# Patient Record
Sex: Female | Born: 1995 | Race: White | Hispanic: No | Marital: Single | Smoking: Former smoker
Health system: Southern US, Community
[De-identification: ages and names within clinical notes are randomized; demographics above are authoritative.]

## PROBLEM LIST (undated history)

## (undated) DIAGNOSIS — S6991XS Unspecified injury of right wrist, hand and finger(s), sequela: Secondary | ICD-10-CM

## (undated) DIAGNOSIS — S61210A Laceration without foreign body of right index finger without damage to nail, initial encounter: Secondary | ICD-10-CM

## (undated) DIAGNOSIS — O0930 Supervision of pregnancy with insufficient antenatal care, unspecified trimester: Secondary | ICD-10-CM

## (undated) DIAGNOSIS — F32A Depression, unspecified: Secondary | ICD-10-CM

## (undated) DIAGNOSIS — J302 Other seasonal allergic rhinitis: Secondary | ICD-10-CM

## (undated) HISTORY — DX: Other seasonal allergic rhinitis: J30.2

## (undated) HISTORY — PX: NO PAST SURGERIES: SHX2092

## (undated) HISTORY — PX: HX DENTAL EXTRACTION: 2100001168

## (undated) HISTORY — DX: Unspecified injury of right wrist, hand and finger(s), sequela: S69.91XS

## (undated) HISTORY — DX: Supervision of pregnancy with insufficient antenatal care, unspecified trimester: O09.30

## (undated) HISTORY — DX: Laceration without foreign body of right index finger without damage to nail, initial encounter: S61.210A

---
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
## 2016-03-21 DIAGNOSIS — F419 Anxiety disorder, unspecified: Secondary | ICD-10-CM | POA: Insufficient documentation

## 2016-03-21 DIAGNOSIS — F411 Generalized anxiety disorder: Secondary | ICD-10-CM | POA: Insufficient documentation

## 2016-04-13 NOTE — L&D Delivery Note (Addendum)
VAGINAL DELIVERY NOTE      Delivery Date: 02/23/2017   Delivery Time: 0737      Patient Name: Crystal Sweeney, Crystal Sweeney  Delivering Provider: Iva Lento, CNM    Delivery Type: Vaginal, Spontaneous Delivery         Labor Course:  Jeniah pushed about 45 minutes and brought baby to crowning easily. The head presented on the perineum which appeared to be adequate.  The baby's head was delivered in a controlled fashion from ROA position.      The neck was checked and there was a nuchal cord which was delivered through.  The baby's shoulders and body delivered easily. The baby was placed on mom's belly where she was dried off.  The cord was doubly clamped and cut and cord bloods were obtained.    The placenta was delivered spontaneously, completely and intact with a three vessel cord.  The perineum, periurethra, and vagina were checked for lacerations. Uterus started to become boggy and fundal massage helped to firm up. Completed manual removal of small clots. Rectal cytotec given.     Episiotomy / Laceration:    Episiotomy: na   Lacerations: 2nd peri and labial   Repair suture: 3.0 vicryl rapide ct and sh     The patient's fundus is firm and there is adequate hemostasis.  She is stable.    Infant:  Crystal Sweeney was a viable female  infant with APGARs of 8  and 9  at 1 and 5 minutes respectively.  Weight pending    Estimated Blood Loss:    Estimated Blood Loss: 400 mL (Filed from Delivery Summary)      Signed by: Iva Lento, CNM

## 2016-07-15 LAB — VH RUBELLA SCREEN, SERUM: Rubella IgG: IMMUNE

## 2016-07-15 LAB — VH STD AMPLIFIED DNA PROBE
Chlamydia trachomatis: NEGATIVE
Neisseria gonorrhoeae: NEGATIVE

## 2016-07-15 LAB — ABO/RH: ABO Rh: A NEG

## 2016-07-15 LAB — CBC
Hematocrit: 37.6 % (ref 36.0–46.0)
Hgb: 12.9 gm/dL (ref 11.5–16.0)
PLT CT: 307 10*3/uL

## 2016-07-15 LAB — RPR: RPR: NONREACTIVE

## 2016-07-15 LAB — HEPATITIS B SURFACE ANTIGEN W/ REFLEX TO CONFIRMATION: Hepatitis B Surface Antigen: NEGATIVE

## 2016-07-15 LAB — ANTIBODY SCREEN: AB Screen Gel: NEGATIVE

## 2016-07-15 LAB — HIV AG/AB 4TH GENERATION: HIV Ag/Ab, 4th Generation: NEGATIVE

## 2016-08-17 ENCOUNTER — Ambulatory Visit
Admission: RE | Admit: 2016-08-17 | Discharge: 2016-08-17 | Disposition: A | Discharge status: Home / Self Care | Payer: Self-pay | Source: Ambulatory Visit | Attending: Family | Admitting: Family

## 2016-09-09 DIAGNOSIS — Z6791 Unspecified blood type, Rh negative: Secondary | ICD-10-CM | POA: Diagnosis present

## 2016-10-09 ENCOUNTER — Encounter: Payer: Self-pay | Admitting: Maternal & Fetal Medicine

## 2016-10-09 ENCOUNTER — Ambulatory Visit
Admission: RE | Admit: 2016-10-09 | Discharge: 2016-10-09 | Disposition: A | Discharge status: Home / Self Care | Payer: Commercial Managed Care - POS | Source: Ambulatory Visit | Attending: Family | Admitting: Family

## 2016-10-09 DIAGNOSIS — Z3686 Encounter for antenatal screening for cervical length: Secondary | ICD-10-CM | POA: Insufficient documentation

## 2016-10-09 DIAGNOSIS — Z3A20 20 weeks gestation of pregnancy: Secondary | ICD-10-CM | POA: Insufficient documentation

## 2016-10-09 DIAGNOSIS — Z3490 Encounter for supervision of normal pregnancy, unspecified, unspecified trimester: Secondary | ICD-10-CM

## 2016-10-09 DIAGNOSIS — O355XX0 Maternal care for (suspected) damage to fetus by drugs, not applicable or unspecified: Secondary | ICD-10-CM | POA: Insufficient documentation

## 2016-10-09 DIAGNOSIS — O358XX0 Maternal care for other (suspected) fetal abnormality and damage, not applicable or unspecified: Secondary | ICD-10-CM | POA: Insufficient documentation

## 2016-11-26 ENCOUNTER — Encounter (HOSPITAL_BASED_OUTPATIENT_CLINIC_OR_DEPARTMENT_OTHER): Payer: Self-pay

## 2016-12-17 DIAGNOSIS — O99019 Anemia complicating pregnancy, unspecified trimester: Secondary | ICD-10-CM | POA: Insufficient documentation

## 2016-12-17 HISTORY — DX: Anemia complicating pregnancy, unspecified trimester: O99.019

## 2016-12-30 ENCOUNTER — Ambulatory Visit
Admission: RE | Admit: 2016-12-30 | Discharge: 2016-12-30 | Disposition: A | Discharge status: Home / Self Care | Payer: Commercial Managed Care - POS | Source: Ambulatory Visit | Attending: Family | Admitting: Family

## 2016-12-30 ENCOUNTER — Encounter: Payer: Self-pay | Admitting: Maternal & Fetal Medicine

## 2016-12-30 DIAGNOSIS — O358XX0 Maternal care for other (suspected) fetal abnormality and damage, not applicable or unspecified: Secondary | ICD-10-CM | POA: Insufficient documentation

## 2016-12-30 DIAGNOSIS — Z3A32 32 weeks gestation of pregnancy: Secondary | ICD-10-CM | POA: Insufficient documentation

## 2016-12-30 DIAGNOSIS — Z3490 Encounter for supervision of normal pregnancy, unspecified, unspecified trimester: Secondary | ICD-10-CM

## 2017-01-27 LAB — GROUP B STREP TRANSCRIBED: GBS Transcribed: NEGATIVE

## 2017-02-22 ENCOUNTER — Inpatient Hospital Stay: Payer: Commercial Managed Care - POS | Admitting: Anesthesiology

## 2017-02-22 ENCOUNTER — Inpatient Hospital Stay
Admission: EM | Admit: 2017-02-22 | Payer: Commercial Managed Care - POS | Source: Ambulatory Visit | Admitting: Obstetrics & Gynecology

## 2017-02-22 ENCOUNTER — Inpatient Hospital Stay
Admission: EM | Admit: 2017-02-22 | Discharge: 2017-02-25 | DRG: 807 | Disposition: A | Discharge status: Home / Self Care | Payer: Commercial Managed Care - POS | Attending: Obstetrics & Gynecology | Admitting: Obstetrics & Gynecology

## 2017-02-22 DIAGNOSIS — Z87891 Personal history of nicotine dependence: Secondary | ICD-10-CM

## 2017-02-22 DIAGNOSIS — Z3490 Encounter for supervision of normal pregnancy, unspecified, unspecified trimester: Secondary | ICD-10-CM

## 2017-02-22 DIAGNOSIS — Z3A40 40 weeks gestation of pregnancy: Secondary | ICD-10-CM

## 2017-02-22 HISTORY — DX: Depression, unspecified: F32.A

## 2017-02-22 LAB — VH URINE DRUG SCREEN - NO CONFIRMATION
Amphetamine: NEGATIVE
Barbiturates: NEGATIVE
Buprenorphine, Urine: NEGATIVE
Cannabinoids: NEGATIVE
Cocaine: NEGATIVE
Opiates: NEGATIVE
Phencyclidine: NEGATIVE
Urine Benzodiazepines: NEGATIVE
Urine Creatinine Random: 59.07 mg/dL
Urine Ecstasy Screen: NEGATIVE
Urine Fentanyl Screen: NEGATIVE
Urine Methadone Screen: NEGATIVE
Urine Oxycodone: NEGATIVE
Urine Specific Gravity: 1.013 (ref 1.001–1.040)
pH, Urine: 5.7 pH (ref 5.0–8.0)

## 2017-02-22 LAB — CBC AND DIFFERENTIAL
Basophils %: 0.8 % (ref 0.0–3.0)
Basophils Absolute: 0.1 10*3/uL (ref 0.0–0.3)
Eosinophils %: 0.2 % (ref 0.0–7.0)
Eosinophils Absolute: 0 10*3/uL (ref 0.0–0.8)
Hematocrit: 37.3 % (ref 36.0–48.0)
Hemoglobin: 13.1 gm/dL (ref 12.0–16.0)
Lymphocytes Absolute: 1.8 10*3/uL (ref 0.6–5.1)
Lymphocytes: 12.2 % — ABNORMAL LOW (ref 15.0–46.0)
MCH: 31 pg (ref 28–35)
MCHC: 35 gm/dL (ref 32–36)
MCV: 87 fL (ref 80–100)
MPV: 9.2 fL (ref 6.0–10.0)
Monocytes Absolute: 0.7 10*3/uL (ref 0.1–1.7)
Monocytes: 4.9 % (ref 3.0–15.0)
Neutrophils %: 82 % — ABNORMAL HIGH (ref 42.0–78.0)
Neutrophils Absolute: 12.2 10*3/uL — ABNORMAL HIGH (ref 1.7–8.6)
PLT CT: 267 10*3/uL (ref 130–440)
RBC: 4.29 10*6/uL (ref 3.80–5.00)
RDW: 12.3 % (ref 11.0–14.0)
WBC: 14.9 10*3/uL — ABNORMAL HIGH (ref 4.0–11.0)

## 2017-02-22 MED ORDER — FAMOTIDINE 10 MG/ML IV SOLN (WRAP)
20.0000 mg | Freq: Two times a day (BID) | INTRAVENOUS | Status: DC | PRN
Start: 2017-02-22 — End: 2017-02-23
  Filled 2017-02-22: qty 2

## 2017-02-22 MED ORDER — LACTATED RINGERS IV SOLN
INTRAVENOUS | Status: DC
Start: 2017-02-22 — End: 2017-02-23

## 2017-02-22 MED ORDER — DIPHENHYDRAMINE HCL 50 MG/ML IJ SOLN
12.5000 mg | Freq: Four times a day (QID) | INTRAMUSCULAR | Status: DC | PRN
Start: 2017-02-22 — End: 2017-02-23

## 2017-02-22 MED ORDER — ONDANSETRON HCL 4 MG/2ML IJ SOLN
8.0000 mg | Freq: Three times a day (TID) | INTRAMUSCULAR | Status: DC | PRN
Start: 2017-02-22 — End: 2017-02-23
  Administered 2017-02-22: 8 mg via INTRAVENOUS
  Filled 2017-02-22 (×2): qty 4

## 2017-02-22 MED ORDER — ACETAMINOPHEN 325 MG PO TABS
650.0000 mg | ORAL_TABLET | ORAL | Status: DC | PRN
Start: 2017-02-22 — End: 2017-02-23

## 2017-02-22 MED ORDER — FENTANYL CITRATE (PF) 50 MCG/ML IJ SOLN (WRAP)
100.0000 ug | INTRAMUSCULAR | Status: DC | PRN
Start: 2017-02-22 — End: 2017-02-23
  Administered 2017-02-22: 100 ug via INTRAVENOUS
  Filled 2017-02-22: qty 2

## 2017-02-22 MED ORDER — EPHEDRINE SULFATE 50 MG/ML IJ/IV SOLN (WRAP)
10.0000 mg | Status: DC | PRN
Start: 2017-02-22 — End: 2017-02-23

## 2017-02-22 MED ORDER — NALOXONE HCL 0.4 MG/ML IJ SOLN (WRAP)
0.1000 mg | INTRAMUSCULAR | Status: DC | PRN
Start: 2017-02-22 — End: 2017-02-23

## 2017-02-22 MED ORDER — VH BUPIVACAINE 0.125% FENTANYL 2 MCG/ML BAG (PCEA)
EPIDURAL | Status: DC
Start: 2017-02-22 — End: 2017-02-23
  Administered 2017-02-23: 05:00:00 150 mL via EPIDURAL
  Filled 2017-02-22: qty 150

## 2017-02-22 MED ORDER — ONDANSETRON 4 MG PO TBDP
4.0000 mg | ORAL_TABLET | Freq: Three times a day (TID) | ORAL | Status: DC | PRN
Start: 2017-02-22 — End: 2017-02-23

## 2017-02-22 MED ORDER — ACETAMINOPHEN 650 MG RE SUPP
650.0000 mg | RECTAL | Status: DC | PRN
Start: 2017-02-22 — End: 2017-02-23

## 2017-02-22 MED ORDER — VH BUPIVACAINE 0.125% FENTANYL 2 MCG/ML BAG (PCEA)
EPIDURAL | Status: AC
Start: 2017-02-22 — End: 2017-02-22
  Administered 2017-02-22: 22:00:00 150 mL via EPIDURAL
  Filled 2017-02-22: qty 150

## 2017-02-22 NOTE — Anesthesia Procedure Notes (Signed)
Epidural    Patient location during procedure: L&D  Reason for block: Labor or C-section  Block at Surgeon's request: Yes    Start time: 02/22/2017 9:30 PM    Staffing  Anesthesiologist: Erven Colla  Performed: anesthesiologist     Pre-procedure Checklist   Completed: patient identified, site marked, surgical consent, pre-op evaluation, timeout performed, risks and benefits discussed, monitors and equipment checked, anesthesia consent given and correct site  Timeout Completed:  02/22/2017 9:30 PM    Epidural    Patient position: sitting    Skin Local: lidocaine 1%    Attempts  Number of attempts: 1                      Approach: midline    Needle type: Touhy needle   Needle gauge: 17  Injection technique: LOR air    CSF Return: No   Blood Return: No  Paresthesia Pain: No    Needle Placement  Needle type: Touhy needle   Needle gauge: 17  Injection technique: LOR air  CSF Return: No  Blood Return: No          Paresthesia Pain: No    Catheter Placement   Catheter type: end hole  Catheter size: 19 G  Catheter at skin depth: 12 cm  CSF Return: No  Blood Return: No  Test Dose:1.5 % lidocaine and negative  3 cc  Incremental injection: yes    No Catheter IV/SA Signs or Symptoms    Assessment   Patient tolerated procedure well: Yes  Block Outcome: no complications

## 2017-02-22 NOTE — OB ED Provider Note (Signed)
TRIAGE PROGRESS NOTE    Date Time: 02/22/17 7:16 PM  Patient Name: Crystal Sweeney    Principal Problem:   Irregular uterine contractions     Subjective:   Patient is a 21 y.o., @ [redacted]w[redacted]d who presents to the OBED with the complaint of irregular uterine contractions present for the past 12+hrs which have gotten progressively stronger and more regular.  Good FM, no LOF, no VB.  Denies HA, CP, SOB, Calf tenderness, Visual changes or Epigastric pain.    Pregnancy also complicated by: none    OB History     Gravida Para Term Preterm AB Living    1 0 0 0 0 0    SAB TAB Ectopic Multiple Live Births    0 0 0 0 0             Review of Systems:     Review of Systems - History obtained from the patient  General ROS: negative for - fever, malaise or night sweats  Psychological ROS: negative for - behavioral disorder, depression or mood swings  Ophthalmic ROS: negative for - blurry vision, double vision or scotomata  Allergy and Immunology ROS: negative for - nasal congestion or seasonal allergies  Respiratory ROS: no cough, shortness of breath, or wheezing  Cardiovascular ROS: no chest pain or dyspnea on exertion  Gastrointestinal ROS: negative for - change in bowel habits, constipation, diarrhea or nausea/vomiting  Genito-Urinary ROS: negative for - dysuria, hematuria, incontinence or urinary frequency/urgency  Musculoskeletal ROS: negative for - gait disturbance, joint pain, joint swelling or muscle pain  Neurological ROS: negative for - behavioral changes, dizziness, headaches or memory loss      Medications:     Prescriptions Prior to Admission   Medication Sig Dispense Refill Last Dose   . Prenatal Vit-Fe Fumarate-FA (PRENATAL VITAMIN PO) Take 1 tablet by mouth daily.   Past Week at Unknown time         Physical Exam:     Vitals:    02/22/17 1856   BP: 136/80   Pulse: 63   Resp: 18   Temp: 97.8 F (36.6 C)       ABDOMIN--NABS, nontender, nondistended  UTERUS--gravid, nontender  EXT--no edema, normal DTRs  PELVIC--per RN  4/70/-4    NST:   NST reviewed:  NST:  reactive  Fetal Heart Baseline Rate: 120 BPM moderate variability, Accelerations seen, No decelerations seen and Category 1      Labs:     Results     Procedure Component Value Units Date/Time    ABO/Rh [[id number]] Resulted:  07/15/16     Specimen:  Blood Updated:  02/22/17 1915     ABO Rh A Neg    Antibody Screen [[id number]] Resulted:  07/15/16     Specimen:  Blood Updated:  02/22/17 1915     AB Screen Gel Negative    CBC without differential [[id number]] Resulted:  07/15/16     Specimen:  Blood from Blood Updated:  02/22/17 1915     Hematocrit 37.6 %     Rubella Screen, Serum [[id number]] Resulted:  07/15/16      Updated:  02/22/17 1853     Rubella IgG immune    GBS Transcribed [[id number]] Resulted:  01/27/17      Updated:  02/22/17 1853     GBS Transcribed Negative    Hepatitis B (HBV) Surface Antigen [[id number]] Resulted:  07/15/16     Specimen:  Blood Updated:  02/22/17 1853  Hepatitis B Surface AG Negative    RPR [[id number]] Resulted:  07/15/16     Specimen:  Blood Updated:  02/22/17 1853     RPR Nonreactive    HIV Ag/Ab 4th generation [[id number]] Resulted:  07/15/16      Updated:  02/22/17 1853     HIV Ag/Ab, 4th Generation negative          No results found for: WBC, HGB, PLT, NA, K, BUN, CREAT, MG, AST, ALB, BNP, LDH, INR, TACROLIMUS, LDL      Assessment:   21 y.o. G1P0000 @ [redacted]w[redacted]d with irregular uterine contractions    Plan:   1.  NST  2.  UDS  3.  Observe for cervical change    Office records available and reviewed.          Signed by: Carmie End, DO

## 2017-02-22 NOTE — Progress Notes (Signed)
LABOR NOTE    VITALS:    Vitals:    02/22/17 2248   BP: 128/67   Pulse: 86   Resp:    Temp:    SpO2:        The fetal heart tracing showed: Baseline Rate: 125 BPM, Variability: Moderate, Pattern: Accelerations, FHR Category: Category I    CERVICAL EXAM:        FHR Category: Category I    Contraction Frequency: 1.5-2    Membrane Status: Artificial    Color: Clear    Dilation: 8    Effacement (%): 100    Station: 1      Pain management: has gotten epidural and is finally comfortable after her bolus was given    Impression:  IUP at term, active labor, progressing quickly    Plan:  Continue expectant management.   Anticipate NSVD      Iva Lento, CNM  02/22/2017 10:50 PM

## 2017-02-22 NOTE — Anesthesia Preprocedure Evaluation (Signed)
Relevant Problems   No relevant active problems               Anesthesia Plan    ASA 2     epidural                                 informed consent obtained                   Signed by: Erven Colla 02/22/17 9:29 PM

## 2017-02-22 NOTE — H&P (Signed)
OB LABOR AND DELIVERY ADMISSION H&P    Date/Time: 11/12/188:30 PM       Name: Crystal Sweeney  MRN: [id number]    Chief Complaint   Patient presents with   . Contractions       HPI:   Crystal Sweeney is a 22 y.o. G4P0000 female with a gestation age of [redacted]w[redacted]d and Estimated Date of Delivery: 02/22/17 who presents to the hospital for:  labor    Her prenatal care is significant for anemia      Review of Systems:     Constitutional: Negative for fever   Eyes: Negative for blurred vision and double vision.   Respiratory: Negative for cough, shortness of breath and wheezing.    Cardiovascular: Negative for chest pain  Gastrointestinal: Negative for heartburn, nausea, diarrhea and constipation.   Genitourinary: Negative for dysuria, urgency and flank pain.   Skin: Negative for rash.   Neurological: Negative for dizziness, seizures and headaches.         OB History     Gravida Para Term Preterm AB Living    1 0 0 0 0 0    SAB TAB Ectopic Multiple Live Births    0 0 0 0 0          Past Medical History:   Diagnosis Date   . Depression        History reviewed. No pertinent surgical history.    No Known Allergies    Prescriptions Prior to Admission   Medication Sig Dispense Refill Last Dose   . Prenatal Vit-Fe Fumarate-FA (PRENATAL VITAMIN PO) Take 1 tablet by mouth daily.   Past Week at Unknown time       Social History     Social History   . Marital status: Single     Spouse name: N/A   . Number of children: N/A   . Years of education: N/A     Occupational History   . Not on file.     Social History Main Topics   . Smoking status: Former Games developer   . Smokeless tobacco: Never Used   . Alcohol use No   . Drug use: No   . Sexual activity: Not on file     Other Topics Concern   . Not on file     Social History Narrative   . No narrative on file         Vital Signs:     Temp:  [97.8 F (36.6 C)] 97.8 F (36.6 C)  Heart Rate:  [63] 63  Resp Rate:  [18] 18  BP: (136)/(80) 136/80  Body mass index is 28.72 kg/m.      Physical  Exam:   Physical Exam   Constitutional: She is oriented to person, place, and time. She appears well-developed.   Cervix:Dilation: 5 (5.5)  Effacement (%): 70  Cervical Characteristics: Posterior, Soft  Station: Ballotable  Presentation: Vertex  Method: Manual  OB Examiner: K. [place] RN   FHT: Baseline Rate: 120 BPM    Head: Normocephalic and atraumatic.   Eyes: Conjunctivae normal and EOM are normal. No scleral icterus.   Neck: Normal range of motion. Neck supple. No tracheal deviation present. No thyromegaly present.   Pulmonary/Chest: Effort normal. No respiratory distress.   Abdominal: Soft.  There is no tenderness. There is no rebound and no guarding.   Uterus: Gravid, no fundal tenderness. EFW 7lb  Genitourinary: There is no rash, tenderness or lesion.    Musculoskeletal: Normal  range of motion  Neurological: She is alert and oriented to person, place, and time. She displays normal reflexes.          Labs:     Results     Procedure Component Value Units Date/Time    Urine Drug Screen [[id number]] Collected:  02/22/17 1903    Specimen:  Urine, Random Updated:  02/22/17 1958     Creatinine, UR 59.07 mg/dL      pH, Urine 5.7 pH      Specific Gravity, UR 1.013     Amphetamine Negative     Barbiturates Negative     Benzodiazepines Negative     Cannabinoids Negative     Cocaine Negative     Methadone Screen, Urine Negative     Opiates Negative     Urine Oxycodone Negative     Phencyclidine Negative     Buprenorphine, Urine Negative     Urine Ecstasy Screen Negative     Urine Fentanyl Screen Negative    CBC without differential [[id number]] Resulted:  07/15/16     Specimen:  Blood from Blood Updated:  02/22/17 1928     PLT CT 307 K/cmm      Hgb 12.9 gm/dL     STD Amplified DNA Probe [[id number]] Resulted:  07/15/16      Updated:  02/22/17 1928     Chlamydia trachomatis Negative     Neisseria gonorrhoeae Negative    ABO/Rh [[id number]] Resulted:  07/15/16     Specimen:  Blood Updated:  02/22/17 1915     ABO Rh A Neg     Antibody Screen [[id number]] Resulted:  07/15/16     Specimen:  Blood Updated:  02/22/17 1915     AB Screen Gel Negative    CBC without differential [[id number]] Resulted:  07/15/16     Specimen:  Blood from Blood Updated:  02/22/17 1915     Hematocrit 37.6 %     Rubella Screen, Serum [[id number]] Resulted:  07/15/16      Updated:  02/22/17 1853     Rubella IgG immune    GBS Transcribed [[id number]] Resulted:  01/27/17      Updated:  02/22/17 1853     GBS Transcribed Negative    Hepatitis B (HBV) Surface Antigen [[id number]] Resulted:  07/15/16     Specimen:  Blood Updated:  02/22/17 1853     Hepatitis B Surface AG Negative    RPR [[id number]] Resulted:  07/15/16     Specimen:  Blood Updated:  02/22/17 1853     RPR Nonreactive    HIV Ag/Ab 4th generation [[id number]] Resulted:  07/15/16      Updated:  02/22/17 1853     HIV Ag/Ab, 4th Generation negative          ABO Rh   Date Value Ref Range Status   07/15/2016 A Neg  Final     Rubella IgG   Date Value Ref Range Status   07/15/2016 immune  Final     Hepatitis B Surface AG   Date Value Ref Range Status   07/15/2016 Negative  Final     GBS Transcribed   Date Value Ref Range Status   01/27/2017 Negative  Final     RPR   Date Value Ref Range Status   07/15/2016 Nonreactive Borderline, Nonreactive Final       Prenatal Care Labs:  Lab Results   Component Value Date    ABORH A Neg 07/15/2016    HEPBSAG  Negative 07/15/2016    GBS Negative 01/27/2017    RPR Nonreactive 07/15/2016    RUBELLAABIGG immune 07/15/2016     Recent Labs:         Assessment/Plan:   IUP at  [redacted]w[redacted]d in active labor   EFW 44% on 9/19  Will admit for delivery.   Anesthesia per her desire.   Fetal status is reassuring.  GBS status:negative    Iva Lento, CNM

## 2017-02-22 NOTE — Progress Notes (Signed)
Monitors removed so pt can ambulate for 1 hour. Pt and mother aware to return at 2015 for recheck. Pt aware to return sooner if contractions get significantly more intense or closer together or if her water breaks.  Crystal Sweeney

## 2017-02-23 LAB — TYPE AND SCREEN
AB Screen: POSITIVE
ABO Rh: A NEG

## 2017-02-23 LAB — VH ANTIBODY ID

## 2017-02-23 MED ORDER — MISOPROSTOL 200 MCG PO TABS
ORAL_TABLET | ORAL | Status: AC
Start: 2017-02-23 — End: 2017-02-23
  Administered 2017-02-23: 08:00:00 1000 ug via RECTAL
  Filled 2017-02-23: qty 5

## 2017-02-23 MED ORDER — VH OXYTOCIN INFUSION 20 UNITS/1000 ML NS (POST PARTUM)
7.5000 [IU]/h | INTRAVENOUS | Status: AC
Start: 2017-02-23 — End: 2017-02-23

## 2017-02-23 MED ORDER — WITCH HAZEL-GLYCERIN EX PADS
1.0000 | MEDICATED_PAD | CUTANEOUS | Status: DC | PRN
Start: 2017-02-23 — End: 2017-02-25

## 2017-02-23 MED ORDER — VH BUPIVACAINE 0.125% FENTANYL 2 MCG/ML BOLUS
10.0000 mL | EPIDURAL | Status: DC | PRN
Start: 2017-02-23 — End: 2017-02-23
  Administered 2017-02-23: 05:00:00 10 mL via EPIDURAL

## 2017-02-23 MED ORDER — MISOPROSTOL 200 MCG PO TABS
1000.0000 ug | ORAL_TABLET | Freq: Once | ORAL | Status: AC
Start: 2017-02-23 — End: 2017-02-23

## 2017-02-23 MED ORDER — VH RHO D IMMUNE GLOBULIN 1500 UNIT/2 ML IJ SOLN
300.0000 ug | Freq: Once | INTRAMUSCULAR | Status: AC
Start: 2017-02-24 — End: 2017-02-24
  Administered 2017-02-24: 15:00:00 300 ug via INTRAVENOUS
  Filled 2017-02-23: qty 2

## 2017-02-23 MED ORDER — METHYLERGONOVINE MALEATE 0.2 MG/ML IJ SOLN
0.2000 mg | Freq: Once | INTRAMUSCULAR | Status: DC | PRN
Start: 2017-02-23 — End: 2017-02-25

## 2017-02-23 MED ORDER — IBUPROFEN 600 MG PO TABS
600.0000 mg | ORAL_TABLET | Freq: Four times a day (QID) | ORAL | Status: DC | PRN
Start: 2017-02-23 — End: 2017-02-24
  Administered 2017-02-23 (×3): 600 mg via ORAL
  Filled 2017-02-23 (×3): qty 1

## 2017-02-23 MED ORDER — OXYCODONE-ACETAMINOPHEN 5-325 MG PO TABS
1.0000 | ORAL_TABLET | ORAL | Status: DC | PRN
Start: 2017-02-23 — End: 2017-02-25
  Administered 2017-02-23 (×2): 1 via ORAL
  Administered 2017-02-24 (×2): 2 via ORAL
  Filled 2017-02-23 (×2): qty 1
  Filled 2017-02-23 (×2): qty 2

## 2017-02-23 MED ORDER — VH OXYTOCIN INFUSION 20 UNITS/1000 ML NS (LABOR)
2.0000 m[IU]/min | INTRAVENOUS | Status: DC
Start: 2017-02-23 — End: 2017-02-23
  Administered 2017-02-23: 03:00:00 2 m[IU]/min via INTRAVENOUS
  Filled 2017-02-23: qty 1000

## 2017-02-23 MED ORDER — MEASLES, MUMPS & RUBELLA VAC SC INJ
0.5000 mL | INJECTION | SUBCUTANEOUS | Status: DC | PRN
Start: 2017-02-23 — End: 2017-02-25

## 2017-02-23 MED ORDER — PRENATAL 27-0.8 MG PO TABS
1.0000 | ORAL_TABLET | Freq: Every day | ORAL | Status: DC
Start: 2017-02-23 — End: 2017-02-25
  Administered 2017-02-24: 11:00:00 1 via ORAL
  Filled 2017-02-23: qty 1

## 2017-02-23 MED ORDER — TETANUS-DIPHTH-ACELL PERTUSSIS 5-2.5-18.5 LF-MCG/0.5 IM SUSP
0.5000 mL | INTRAMUSCULAR | Status: DC | PRN
Start: 2017-02-23 — End: 2017-02-25

## 2017-02-23 MED ORDER — BENZOCAINE-MENTHOL 20-0.5 % EX AERO
1.0000 | INHALATION_SPRAY | CUTANEOUS | Status: DC | PRN
Start: 2017-02-23 — End: 2017-02-25
  Administered 2017-02-23: 09:00:00 1 via TOPICAL
  Filled 2017-02-23: qty 56

## 2017-02-23 MED ORDER — DOCUSATE SODIUM 100 MG PO CAPS
200.0000 mg | ORAL_CAPSULE | Freq: Two times a day (BID) | ORAL | Status: DC | PRN
Start: 2017-02-23 — End: 2017-02-25
  Administered 2017-02-24: 11:00:00 200 mg via ORAL
  Filled 2017-02-23: qty 2

## 2017-02-23 NOTE — Lactation Note (Signed)
In to see dyad for ordered lactation consult.  She reports infant nursed well once after delivery for 10-15 minutes.  Patient stated she was worried infant was not getting enough since her milk was not in but reminded mom that infant is less than 24 hours since delivery and reviewed the size of a newborns stomach.  Also discussed the "recovery nap" phase and that this is normal for the first day of life.  Infant was dressed, bundled, and using a pacifier.  I encouraged skin to skin and reviewed the risks of using a pacifier prior to breastfeeding being well established.  Reinforced number of wet and dirty diapers as well as number of feeds to expect each day.    Patient had two young children in the room with her holding the infant whom she stated were hers when asked (the older woman in the room did not correct her) and when I asked her if she had breastfed those other children she stated "yes".  After asking how long she breastfed her other children she stated "briefly" and continued to look at her phone.  Once charting on mom after leaving the room and realizing she was a G1P1 I went back and again asked her if she had breastfed the girls in the room and she stated yes as she continued to use her phone not making eye contact with me.  I continued to clarify with the patient based on her chart saying she was a first time mom and the older woman in the room then stated that the two girls in the room were her children and the patient was her daughter.  Sarah, RN is aware of my conversation with the patient.      Mother has been provided with the Postpartum folder and I referred her to the breastfeeding information.      Patient is aware of lactation consult availability and mother has LC mobile phone number for assistance in hospital and "warm line" phone number for follow up if needed.  Patient was advised to call if assistance is needed or desired but does not desire assistance at this time.

## 2017-02-23 NOTE — UM Notes (Signed)
[facility] Utilization Management Review Sheet    Facility :  [facility]    NAME: Crystal Sweeney  MR#: [id number]    CSN#: [id number]    ROOM: 222/222-A AGE: 21 y.o.    ADMIT DATE AND TIME: 02/22/2017  6:29 PM      PATIENT CLASS:  Inpatient     ATTENDING PHYSICIAN: Dava Najjar, MD  PAYOR:Payor: Monia Pouch / Plan: [employer] POS / Product Type: COMMERCIAL /       AUTH #:     DIAGNOSIS:  SVD    HISTORY:   Past Medical History:   Diagnosis Date   . Depression        DATE OF REVIEW: 02/23/2017    VITALS: BP 121/55   Pulse 72   Temp 99.6 F (37.6 C) (Oral)   Resp 16   Ht 1.549 m (5\' 1" )   Wt 68.9 kg (152 lb)   SpO2 97%   Breastfeeding? Yes   BMI 28.72 kg/m     Active Hospital Problems    Diagnosis   . NSVD (normal spontaneous vaginal delivery)   . Term pregnancy     [phone]:  Admitted in labor.  G1P1  EDC:  [id number]  SVD on 111318 @ 7:36 am  Female 6 lbs 9 oz 2977 gms  Apgar:  8/9  Gestation:  40 wks 1 day         Rush Barer. Adeli Frost, RN   Utilization Review Nurse  Care Management   [facility]  [address]  [place] [address]  Ph: [phone]  F:  [phone]  [email or web address]

## 2017-02-23 NOTE — Progress Notes (Signed)
LABOR NOTE    VITALS:    Vitals:    02/23/17 0632   BP: 107/59   Pulse: 84   Resp:    Temp:    SpO2:        The fetal heart tracing showed: Baseline Rate: 115 BPM, Variability: Moderate, Pattern: Accelerations, Early decelerations, FHR Category: Category I    CERVICAL EXAM:        FHR Category: Category I    Contraction Frequency: 2-4    Membrane Status: Artificial    Color: Clear    Dilation: 10    Effacement (%): 100 (Simultaneous filing. User may be unaware of other data.)    Station: 2 (Simultaneous filing. User may be unaware of other data.)      Pain management: has gotten epidural and is comfortable    Impression:  IUP at term, active labor, progressing normally    Plan:  Pt complete and baby is low  Will set pt up to start pushing  Anticipate NSVD      Iva Lento, CNM  02/23/2017 6:33 AM

## 2017-02-23 NOTE — Plan of Care (Signed)
Problem: Vaginal/Cesarean Delivery  Goal: Maternal Status within defined parameters  Outcome: Progressing   02/23/17 0613   Goal/Interventions addressed this shift   Maternal status with defined parameters  Monitor/assess vital signs;Perform physical assessment per phase of care     Goal: Evidence of Fetal Well Being  Outcome: Progressing   02/23/17 0613   Goal/Interventions addressed this shift   Evidence of fetal well being Monitor/assess fetal heart rate. Notify LIP of Category II or III EFM tracings.;Initiate interventions for Category II or III EFM strip;Position patient for maximum uterine perfusion;Patient's position should support labor progress and expulsion efforts     Goal: Intrapartum management of pain/discomfort  Outcome: Progressing      Comments: PT being augmented with Pitocin. Epidural in place.   Family supportive at bedside.

## 2017-02-23 NOTE — Plan of Care (Signed)
Problem: Vaginal/Cesarean Delivery  Goal: Maternal Status within defined parameters  Outcome: Completed Date Met: 02/23/17    Goal: Evidence of Fetal Well Being  Outcome: Completed Date Met: 02/23/17    Goal: Intrapartum management of pain/discomfort  Outcome: Completed Date Met: 02/23/17    Goal: Postpartum management of pain/discomfort  Outcome: Completed Date Met: 02/23/17

## 2017-02-23 NOTE — Consults (Signed)
See lactation note.

## 2017-02-24 LAB — RH IMM GLOBULIN
ABO Rh: A NEG
Fetal Bleed Screen: NEGATIVE
Order Quantity: 1

## 2017-02-24 LAB — CBC
Hematocrit: 31.1 % — ABNORMAL LOW (ref 36.0–48.0)
Hemoglobin: 10.6 gm/dL — ABNORMAL LOW (ref 12.0–16.0)
MCH: 31 pg (ref 28–35)
MCHC: 34 gm/dL (ref 32–36)
MCV: 90 fL (ref 80–100)
MPV: 8.4 fL (ref 6.0–10.0)
PLT CT: 163 10*3/uL (ref 130–440)
RBC: 3.47 10*6/uL — ABNORMAL LOW (ref 3.80–5.00)
RDW: 12.5 % (ref 11.0–14.0)
WBC: 9.6 10*3/uL (ref 4.0–11.0)

## 2017-02-24 MED ORDER — IBUPROFEN 600 MG PO TABS
600.0000 mg | ORAL_TABLET | Freq: Four times a day (QID) | ORAL | Status: DC
Start: 2017-02-24 — End: 2017-02-25
  Administered 2017-02-24 – 2017-02-25 (×4): 600 mg via ORAL
  Filled 2017-02-24 (×4): qty 1

## 2017-02-24 MED ORDER — CITALOPRAM HYDROBROMIDE 20 MG PO TABS
20.0000 mg | ORAL_TABLET | Freq: Every day | ORAL | Status: DC
Start: 2017-02-24 — End: 2017-02-25
  Administered 2017-02-24: 11:00:00 20 mg via ORAL
  Filled 2017-02-24: qty 1

## 2017-02-24 NOTE — Plan of Care (Signed)
Problem: Vaginal/Cesarean Delivery  Goal: Breasts are soft with nipple integrity intact  Outcome: Progressing   02/24/17 0145   Goal/Interventions addressed this shift   Breasts are soft with nipple integrity intact Perform breast/nipple assessment;Assess and manage engorgement;Breastfeed and/or pump breasts at least 8-12 times within 24 hours;Ensure proper positioning and latch;Provide pharmacologic and non-pharmacologic interventions as needed     Goal: Gastrointestinal/Urinary management  Outcome: Progressing   02/24/17 2141   Goal/Interventions addressed this shift   Gastrointestinal/Urinary management Monitor intake and output per orders;Adequate bladder emptying per phase of care;Offer pharmacologic and non-pharmacologic GI management interventions     Goal: Uterine management  Outcome: Progressing   02/24/17 0145   Goal/Interventions addressed this shift   Uterine management Assess fundus and notify LIP if not firm, midline, or at or below the umbilicus, or if abdomen is abnormally distended;Assess for hemorrhage risk using appropriate screening tool     Goal: Perineum will be clean, dry, and intact and without discharge or hematoma  Outcome: Progressing   02/24/17 2141   Goal/Interventions addressed this shift   Perineum will be clean, dry, and intact and without discharge or hematoma Place cold pack on perineum;Monitor wound/incision for signs of infections;Assess for hemorrhoids and provide interventions     Goal: VTE Prevention  Outcome: Progressing   02/24/17 0145   Goal/Interventions addressed this shift   VTE prevention Patient ambulating independently;Assess skin color, turgor, peripheral pulses, perfusion, and presence of edema, e.g. capillary refill     Goal: Evidence of positive mother-baby interactions  Outcome: Progressing   02/24/17 0145   Goal/Interventions addressed this shift   Evidence of positive mother-baby interactions Include patient/patient care companion in decisions related to  care;Initiate skin to skin;Initiate safety and falls prevention interventions;Assess emotional status and coping mechanisms;Encourage rooming in and infant feeding on demand;Ensure parent/caregiver provides infant care;Assess parent/caregiver engagement and awareness of infant cues/behavior

## 2017-02-24 NOTE — Consults (Signed)
CM consult due to PPD score 22. Pt is G1 P1 progressing well post SVD. Pt Hx depression & anxiety. Pt states she had counseling in 5th grade due to depression. Pt denies childhood physical, emotional or sexual abuse. She is oriented to person, place and date. Pt states she did not care for counseling at that age and has not had counseling since then. Pt states she had suicidal ideation at age 21 "I thought if I got hit by a car, it wouldn't matter." Pt started on Celexa and states it worked well to control her depression and anxiety in the past. OB has restarted med now postpartum. Pt denies current thoughts of harming self or others and denies any need for counseling at this time. Pt lives with her father and stepmother who plan to assist her p D/C. Pt is bonding well with her newborn. FOB is present and supportive but does not live with pt. Pt and FOB instr re S&S PPD and PP psychosis and encouraged to contact OB or therapist at first signs PPD. Pt provided with list of area resources including [place] Suicide Prevention Hotline #.   Pt has car seat, bassinet, crib, baby clothes and initial infant supplies. Pt was not aware that her father's insurance might not cover her newborn: [facility] referral made.

## 2017-02-24 NOTE — Progress Notes (Signed)
Postpartum Day 1 SVD      Subjective:     Patient doing well. Voiding, ambulating, tolerating regular diet.  Complains of feeing a bit overwhelmed. EPPDS was 22 and patient had an episode last pm where she was quite agitated. She previously was on Celexa with good control of mood disorder. Patient is breastfeeding. Patient notes her bleeding to be moderate.    Objective:     Temp Readings from Last 1 Encounters:   02/24/17 98.9 F (37.2 C) (Oral)     BP Readings from Last 1 Encounters:   02/24/17 103/55     Pulse Readings from Last 1 Encounters:   02/24/17 (!) 56      Physical Exam:   General-Pleasant, well nourished female.NAD  Breasts: breasts appear normal, no suspicious masses, no skin or nipple changes or axillary nodes.Nipples everted, intact bilaterally  CVS exam: normal rate, regular rhythm, normal S1, S2, no murmurs, rubs, clicks or gallops.  Respiratory: Effortless, CTAB  Fundus--firm NT at Umbilicus -2  Perineum- Well approximated, without erythema,edema, or bruising  Ext--calves non tender,no pedal edema    Lab Results   Component Value Date    HGB 10.6 (L) 02/24/2017    HGB 13.1 02/22/2017    HCT 31.1 (L) 02/24/2017    HCT 37.3 02/22/2017      Assessment/Plan:     Impression:    1. Stable PPD 1 s/p SVD.   2. Doing well.    Plan:    1. Routine PP care  2. Anticipate D/C home tomorrow  3. Birth control: Condoms    Neysa Hotter, [place]  02/24/2017 8:12 AM

## 2017-02-24 NOTE — Lactation Note (Signed)
Celexa is a category L2, according to [employer] and [employer].

## 2017-02-24 NOTE — Plan of Care (Signed)
Problem: Vaginal/Cesarean Delivery  Goal: Breasts are soft with nipple integrity intact  Outcome: Progressing   02/24/17 0145   Goal/Interventions addressed this shift   Breasts are soft with nipple integrity intact Perform breast/nipple assessment;Assess and manage engorgement;Breastfeed and/or pump breasts at least 8-12 times within 24 hours;Ensure proper positioning and latch;Provide pharmacologic and non-pharmacologic interventions as needed     Breast shell for assistance to protrude nipples.   Goal: Gastrointestinal/Urinary management  Outcome: Progressing   02/24/17 0145   Goal/Interventions addressed this shift   Gastrointestinal/Urinary management Use CAUTI prevention techniques;Maintain urine output of 30/mL per hour or greater;Monitor intake and output per orders;Adequate bladder emptying per phase of care;Offer pharmacologic and non-pharmacologic GI management interventions     Goal: Uterine management  Outcome: Progressing   02/24/17 0145   Goal/Interventions addressed this shift   Uterine management Assess fundus and notify LIP if not firm, midline, or at or below the umbilicus, or if abdomen is abnormally distended;Assess for hemorrhage risk using appropriate screening tool     Goal: Perineum will be clean, dry, and intact and without discharge or hematoma  Outcome: Progressing   02/24/17 0145   Goal/Interventions addressed this shift   Perineum will be clean, dry, and intact and without discharge or hematoma Provide pericare;Monitor wound/incision for signs of infections;Assess for hemorrhoids and provide interventions     Goal: VTE Prevention  Outcome: Progressing   02/24/17 0145   Goal/Interventions addressed this shift   VTE prevention Patient ambulating independently;Assess skin color, turgor, peripheral pulses, perfusion, and presence of edema, e.g. capillary refill     Goal: Evidence of positive mother-baby interactions  Outcome: Progressing   02/24/17 0145   Goal/Interventions addressed this  shift   Evidence of positive mother-baby interactions Include patient/patient care companion in decisions related to care;Initiate skin to skin;Initiate safety and falls prevention interventions;Assess emotional status and coping mechanisms;Encourage rooming in and infant feeding on demand;Ensure parent/caregiver provides infant care;Assess parent/caregiver engagement and awareness of infant cues/behavior     Postpartum depression scale completed by pt this shift. She is high risk and referrals placed. She also comes up as a suicide risk. This was discussed with the pt and the FOB, there is no intent of hurting herself at this time. The charge nurse also notified of referrals made.

## 2017-02-24 NOTE — Consults (Signed)
SW received consult for "postpartum depression score 22.".  SW notified RNCM of this consult.          Devota Pace, BSW   Medical Social Worker   [phone]

## 2017-02-25 MED ORDER — CITALOPRAM HYDROBROMIDE 20 MG PO TABS
20.0000 mg | ORAL_TABLET | Freq: Every day | ORAL | 0 refills | Status: AC
Start: 2017-02-25 — End: ?

## 2017-02-25 MED ORDER — IBUPROFEN 600 MG PO TABS
600.0000 mg | ORAL_TABLET | Freq: Four times a day (QID) | ORAL | 0 refills | Status: AC
Start: 2017-02-25 — End: ?

## 2017-02-25 NOTE — Discharge Summary -  Nursing (Signed)
PT states she has no headache, troubles with vision, eating well without nausea, and urinating without pain. PT has been bonding well with infant and breastfeeding / feeding each 2-3 hrs. Worked on wide latch this AM. Blood pressure remains under control and all other vitals are WNL. PT has been able to rest for short periods and seems to be in good spirits without any emotional concerns. PT will start taking Celexa medication for symptoms of depression. Night shift RN reports this Am there there have been no recent thoughts of harming herself. Infant's father has been visiting and continues to support both the PT and her infant. No other concerns at this time. PT being discharged at this time.

## 2017-02-25 NOTE — Discharge Summary (Signed)
OBSTETRICS - DELIVERY DISCHARGE SUMMARY    02/25/2017 7:48 AM    Admission Date: 02/22/2017  Discharge Date: 02/25/17     Discharge Diagnosis:  Term intrauterine pregnancy now delivered via spontaneous vaginal delivery .    Date of Delivery: 02/23/2017    Time of Delivery:  7:36 AM    Delivered By: Iva Lento   Delivery Type: Vaginal, Spontaneous Delivery   EDC: Estimated Date of Delivery: 02/22/17 Gestational Age: [redacted]w[redacted]d  Baby: Liveborn Female ; Apgar 1 minute: 8  Apgar 5 minute: 9 ; Birth Weight: 6 lb 9 oz (2977 g)   Anesthesia:   Delivery Complications: None.  Laceration: Yes  Laceration Type: 2nd;Labial  degree. Episiotomy: None     Placenta: , , , , Cord:      Feeding Method:       Patient Active Problem List   Diagnosis   . NSVD (normal spontaneous vaginal delivery)       Hospital Course: Crystal Sweeney is a 21 y.o. female admitted to the [facility] and delivered via Vaginal, Spontaneous Delivery  delivery. Her postpartum course was uncomplicated. The patient's vital signs remained stable and the patient remained afebrile throughout her hospitalization. She was discharged to home in stable condition.    Postpartum Complications: None.    Discharge Medications:  Current Discharge Medication List      START taking these medications    Details   citalopram (CELEXA) 20 MG tablet Take 1 tablet (20 mg total) by mouth daily.  Qty: 30 tablet, Refills: 0      ibuprofen (ADVIL,MOTRIN) 600 MG tablet Take 1 tablet (600 mg total) by mouth every 6 (six) hours.  Qty: 60 tablet, Refills: 0         CONTINUE these medications which have NOT CHANGED    Details   Prenatal Vit-Fe Fumarate-FA (PRENATAL VITAMIN PO) Take 1 tablet by mouth daily.           Discharge Followup:  Unless otherwise instructed:  For this vaginal delivery we recommend that you followup in three (3) weeks for a postpartum checkup at the site of your prenatal care    Discharge Instructions:  Patient to follow discharge  instructions as provided at the time of discharge.    Neysa Hotter, CNM

## 2017-02-25 NOTE — Progress Notes (Signed)
Postpartum Day 2 SVD      Subjective:     Patient doing well. Voiding, ambulating, tolerating regular diet.  Complains of nothing. Patient is breastfeeding. Patient notes her bleeding to be moderate.    Objective:     Temp Readings from Last 1 Encounters:   02/25/17 98.2 F (36.8 C) (Oral)     BP Readings from Last 1 Encounters:   02/25/17 116/57     Pulse Readings from Last 1 Encounters:   02/25/17 (!) 54        Physical Exam:   General-Pleasant, well nourished female.NAD  Breasts: breasts appear normal, no suspicious masses, no skin or nipple changes or axillary nodes.Nipples everted, intact bilaterally  CVS exam: normal rate, regular rhythm, normal S1, S2, no murmurs, rubs, clicks or gallops.  Respiratory: Effortless, CTAB  Fundus--firm NT at Umbilicus -2  Perineum- Well approximated, without erythema,edema, or bruising  Ext--calves non tender,no pedal edema    Lab Results   Component Value Date    HGB 10.6 (L) 02/24/2017    HGB 13.1 02/22/2017    HCT 31.1 (L) 02/24/2017    HCT 37.3 02/22/2017        Assessment/Plan:     Impression:    1. Stable PPD 2 s/p SVD.   2. Doing well.Ready for discharge home    Plan:    1. Routine PP care  2. Anticipate D/C home today  3. Birth control: undecided    Neysa Hotter, [place]  02/25/2017 7:46 AM

## 2018-02-03 ENCOUNTER — Other Ambulatory Visit: Payer: Self-pay

## 2018-02-03 ENCOUNTER — Emergency Department (EMERGENCY_DEPARTMENT_HOSPITAL): Payer: Managed Care, Other (non HMO)

## 2018-02-03 ENCOUNTER — Emergency Department
Admission: EM | Admit: 2018-02-03 | Discharge: 2018-02-03 | Disposition: A | Discharge status: Home / Self Care | Payer: Managed Care, Other (non HMO) | Attending: Emergency Medicine | Admitting: Emergency Medicine

## 2018-02-03 DIAGNOSIS — S61219A Laceration without foreign body of unspecified finger without damage to nail, initial encounter: Secondary | ICD-10-CM

## 2018-02-03 DIAGNOSIS — S61419A Laceration without foreign body of unspecified hand, initial encounter: Secondary | ICD-10-CM

## 2018-02-03 DIAGNOSIS — W260XXA Contact with knife, initial encounter: Secondary | ICD-10-CM | POA: Insufficient documentation

## 2018-02-03 DIAGNOSIS — S61411A Laceration without foreign body of right hand, initial encounter: Secondary | ICD-10-CM

## 2018-02-03 DIAGNOSIS — F17210 Nicotine dependence, cigarettes, uncomplicated: Secondary | ICD-10-CM | POA: Insufficient documentation

## 2018-02-03 NOTE — ED Nurses Note (Signed)
Dr. Parikh at bedside to suture pt

## 2018-02-03 NOTE — ED Provider Notes (Signed)
[facility]  Emergency Department     HISTORY OF PRESENT ILLNESS     Date:  02/03/2018  Patient's Name:  Shari Lawson  Date of Birth:  1995-06-22      History provided by:  Patient     Shari Lawson is a 22 year old female who presents to the ED for several lacerations on the right hand. The patient was carving pumpkins this afternoon when the knife slipped and cut her palm. Tetanus is up to date. Denies numbness, lightheadedness, and dizziness.     Review of Systems     Review of Systems   Skin: Positive for wound (right palm).   Neurological: Negative for dizziness, light-headedness and numbness.   All other systems reviewed and are negative.    Previous History     Past Medical History:  History reviewed. No pertinent past medical history.    Past Surgical History:  History reviewed. No pertinent surgical history.    Social History:  Social History     Tobacco Use   . Smoking status: Current Every Day Smoker     Packs/day: 0.50     Years: 3.00     Pack years: 1.50   Substance Use Topics   . Alcohol use: Yes     Comment: occasional   . Drug use: No     Social History     Substance and Sexual Activity   Drug Use No       Family History:  No family history on file.    Medication History:  No current outpatient medications on file.       Allergies:  No Known Allergies    Physical Exam     Vitals:    BP 96/65   Pulse 67   Temp 37.3 C (99.1 F)   Resp 16   Ht 1.549 m (5\' 1" )   Wt 51.3 kg (113 lb)   LMP 01/28/2018 (Exact Date)   SpO2 97%   BMI 21.35 kg/m     Physical Exam   Nursing note and vitals reviewed.  Constitutional: Well-developed and well-nourished. Awake & alert. No distress.  Head: Atraumatic. Normocephalic.    Eyes: PERRL. EOMI. Conjunctivae are not pale.  ENT: Mucous membranes are pink and moist. TMs are unremarkable. Oropharynx is clear and symmetric. Patent airway.  Neck: Supple. Full ROM. No JVD.  No lymphadenopathy.  Cardiovascular: Regular rate.   Regular rhythm. No murmurs, rubs, or gallops.   Pulmonary/Chest: No evidence of respiratory distress. Clear to auscultation bilaterally. No wheezing, rales or rhonchi. Chest non-tender.  Abdominal: Soft and non-distended. There is no tenderness. No rebound or guarding. No organomegaly. Normal bowel sounds.    Back: No CVA tenderness. FROM.   Extremities: No cyanosis. No clubbing. Full range of motion in all extremities. No edema.  Skin: Skin is warm and dry. No diaphoresis. No rash. 3cm on the right medial portion of the right palm. 1cm on the left middle finger. 1.5cm on the right index finger on the plantar surface. No tendon involvement. FORM.  Neurological: Alert, awake, and appropriate. Normal speech. Normal sensation. Motor strengths 5/5. CN II-XII intact.   Psychiatric: Good eye contact. Normal interaction, affect, and behavior.    Diagnostic Studies/Treatment     Medications:  Medications - No data to display    There are no discharge medications for this patient.      Labs:    No results found for any visits on 02/03/18.  Radiology:  XR HAND RIGHT SERIES    XR HAND RIGHT SERIES   Final Result   No evidence of acute fracture or dislocation.             Radiologist location ID: [id number]             ECG:  NONE      Procedure     Procedures  ------------------- PROCEDURE: LACERATION REPAIR  --------------------    Performed by the emergency provider  Time: 15:48h  Location:  Medial portion of right palm.  Length: 3 cm  Description: clean appearing  Distal CMS:  Normal.  No deficits.  Neurovascularly intact.  Anesthesia: Lidocaine 1%  Preparation: The wound was cleansed with saline. The area was prepped and draped in the usual sterile fashion.   Exploration:   The wound was explored and no foreign bodies were found.  Procedure: The wound was closed with 5-0 nylon.  There was good approximation.  In total, 4 sutures were used.  Post-Procedure:  Good closure and hemostasis.  The patient tolerated the procedure  well and there were no complications.  CSM remains intact.  Post procedure dressing applied by RN.    ------------------- PROCEDURE: LACERATION REPAIR  --------------------    Performed by the emergency provider  Time: 15:48h  Location:  Right index finger  Length: 1.5 cm  Description: clean appearing  Distal CMS:  Normal.  No deficits.  Neurovascularly intact.  Anesthesia: Lidocaine 1%  Preparation: The wound was cleansed with saline. The area was prepped and draped in the usual sterile fashion.   Exploration:   The wound was explored and no foreign bodies were found.  Procedure: The wound was closed with 5-0 nylon.  There was good approximation.  In total, 4 sutures were used.  Post-Procedure:  Good closure and hemostasis.  The patient tolerated the procedure well and there were no complications.  CSM remains intact.  Post procedure dressing applied by RN.    ------------------- PROCEDURE: LACERATION REPAIR   --------------------    Performed by the emergency provider  Time: 15:48h  Location:  Right middle finger  Length: 1 cm  Description: clean appearing  Distal CMS:  Normal.  No deficits.  Neurovascularly intact.  Anesthesia: Lidocaine 1%  Preparation: The wound was cleansed with saline. The area was prepped and draped in the usual sterile fashion.   Exploration:   The wound was explored and no foreign bodies were found.  Procedure: The wound was closed with 5-0 nylon.  There was good approximation.  In total, 2 sutures were used.  Post-Procedure:  Good closure and hemostasis.  The patient tolerated the procedure well and there were no complications.  CSM remains intact.  Post procedure dressing applied by RN.    Course/Disposition/Plan     Course:  14:50h: Initial evaluation is complete at this time. I discussed with the patient that I would perform a  laceration repair. Patient is agreeable with the treatment plan at this time.    16:15h: Based on my history, physical exam, and diagnostic evaluation, the  patient appears to have symptoms consistent with multiple laceration on the right hand.  There is no evidence of any tendon involvement as patient can move all extremities and finger tips.  The patient is to follow-up with their primary care physician or urgent care in for suture removal in 10 days. Patient was advised of our evaluation and told to return to the emergency department if symptoms change, worsen, or new  symptoms develop. Answered questions to satisfaction and the patient is stable for discharge.           Disposition:    Discharged    Condition at Disposition:   Stable      Follow up:   YOUR DOCTOR OR URGENT CARE          Jarvis Morgan, MD  [address]  [address]  [address]  [place] [address]  [phone]    Schedule an appointment as soon as possible for a visit   Hand Surgeon    Clinical Impression:     Encounter Diagnosis   Name Primary?   . Laceration of multiple sites of hand and fingers Yes     Future Appointments Scheduled in Epic:  No future appointments.    SCRIBE ATTESTATION   This note is prepared by Roosevelt Locks, acting as Scribe for Dr. Ramiro Harvest    The scribe's documentation has been prepared under my direction and personally reviewed by me in its entirety.  I confirm that the note above accurately reflects all work, treatment, procedures, and medical decision making performed by me, Dr. Ramiro Harvest

## 2018-02-03 NOTE — ED Triage Notes (Signed)
Pt amb to ER in NAD. Reports laceration to palm of right hand while carving a pumpkin. Reports went to push down on knife into pumpkin when hand slid down the blade. Wrapped during triage. Reports tetanus UTD.

## 2018-02-03 NOTE — Discharge Instructions (Signed)
HAVE SUTURES REMOVED IN 10 DAYS THROUGH YOUR DOCTOR OR URGENT CARE  FOLLOW-UP WITH THE HAND SURGEON LISTED TO MAKE SURE YOU HEAL PROPERLY  RETURN TO ER FOR WORSENING OR CONCERNING SYMPTOMS

## 2018-02-03 NOTE — ED Nurses Note (Signed)
Wound cleaned, bacitracin applied and covered with kling.

## 2018-02-03 NOTE — ED Nurses Note (Signed)
Patient discharged home with family.  AVS reviewed with patient/care giver.  A written copy of the AVS and discharge instructions was given to the patient/care giver.  Questions sufficiently answered as needed.  Patient/care giver encouraged to follow up with PCP as indicated.  In the event of an emergency, patient/care giver instructed to call 911 or go to the nearest emergency room. Pt provided with suture kit removal.

## 2018-02-08 ENCOUNTER — Ambulatory Visit (INDEPENDENT_AMBULATORY_CARE_PROVIDER_SITE_OTHER): Payer: No Typology Code available for payment source | Admitting: Physician Assistant

## 2018-02-08 ENCOUNTER — Encounter (INDEPENDENT_AMBULATORY_CARE_PROVIDER_SITE_OTHER): Payer: Self-pay

## 2018-02-08 VITALS — BP 113/42 | HR 58 | Temp 98.4°F | Resp 16 | Ht 61.0 in | Wt 113.0 lb

## 2018-02-08 DIAGNOSIS — Y998 Other external cause status: Secondary | ICD-10-CM

## 2018-02-08 DIAGNOSIS — L089 Local infection of the skin and subcutaneous tissue, unspecified: Secondary | ICD-10-CM

## 2018-02-08 DIAGNOSIS — S61411A Laceration without foreign body of right hand, initial encounter: Secondary | ICD-10-CM

## 2018-02-08 DIAGNOSIS — Y9289 Other specified places as the place of occurrence of the external cause: Secondary | ICD-10-CM

## 2018-02-08 DIAGNOSIS — Y9389 Activity, other specified: Secondary | ICD-10-CM

## 2018-02-08 DIAGNOSIS — W260XXA Contact with knife, initial encounter: Secondary | ICD-10-CM

## 2018-02-08 MED ORDER — CEPHALEXIN 500 MG PO CAPS
500.0000 mg | ORAL_CAPSULE | Freq: Four times a day (QID) | ORAL | 0 refills | Status: AC
Start: 2018-02-08 — End: 2018-02-15

## 2018-02-08 NOTE — Patient Instructions (Signed)
Suture Care    Stitches (sutures) are used to close wounds. Sutures also help stop bleeding and speed healing. To help your wound heal, follow the tips on this handout.  Some sutures need to be removed by a healthcare provider. Others dissolve on their own. Sometimes strips of tape are used. You'll be told what kind of sutures you have.   Keep sutures clean   Avoid doing things that could cause dirt or sweat to get on your sutures. If needed, cover your sutures with a bandage (dressing) to protect them.   Don't pick at scabs. They help protect the wound.   Don't wash the area around your sutures unless your healthcare provider says it's OK. Then, follow his or her instructions for washing and drying.  Keep sutures dry   Keep your sutures out of water.   Take a sponge bath to avoid getting your sutures wound wet, unless your healthcare provider tells you otherwise.   Ask your provider when can you take a shower or bathe.   Ask your provider about the best way to keep your sutures dry when bathing or showering.   If sutures get damp, pat them dry.  Changing your dressing  Leave the dressing in place until you are told to remove it or change it. Change it only as directed, using clean hands:   After the first ___hours, change your dressing every ___hours.   Change your dressing if it gets wet or dirty.  Other tips   To help wounds on an arm or leg heal, use the affected limb as little as possible.   To help reduce swelling and throbbing, raise the area with sutures above your heart.   To help prevent itching, cover sutures with gauze. If sutures itch, try not to scratch them.   For pain relief, try acetaminophen or ibuprofen. Don't use aspirin. It can increase bleeding.  When to seek medical care  Call your healthcare provider if you notice any of the following signs:   Increased soreness, pain, or tenderness after 24 hours   A red streak, increased redness, or puffiness near the wound   White,  yellowish, or bad smelling discharge from the wound   Bleeding that can't be stopped by applying pressure   Steri-Strips fall off or stitches dissolve before the wound heals   Fever over 100.4F (38.0C)   Date Last Reviewed: 10/12/2014   2000-2019 [employer] [address], [place] [address]. All rights reserved. This information is not intended as a substitute for professional medical care. Always follow your healthcare professional's instructions.

## 2018-02-09 NOTE — Progress Notes (Addendum)
Subjective:    Patient ID: Crystal Sweeney is a 22 y.o. female.    HPI Pt presents today due to a wound on her hand that she believes may be infected.  Pt had a laceration repair done at [facility] ED 5 days ago and pt now believes the wound may be infected.  Pt had cut her hand carving pumpkins and received sutures across the palmar surface of her hand.  Pt states she was not given antibiotics and was not given instructions on how to care for the wound and was given a suture removal kit and told they could be removed in ten days.  She is in a good deal of pain, 8/10, and the area around the wound has become red and swollen.  She has not seen discharge but it has bled some.  Pt is UTD on tetanus.  Denies fever, n/v.    The following portions of the patient's history were reviewed and updated as appropriate: allergies, current medications, past family history, past medical history, past social history, past surgical history and problem list.    Review of Systems   Constitutional: Negative for chills and fever.   Respiratory: Negative for chest tightness and shortness of breath.    Cardiovascular: Negative for chest pain.   Gastrointestinal: Negative for abdominal pain, nausea and vomiting.   Musculoskeletal: Positive for joint swelling (right index finger).   Skin: Positive for wound (right hand).   Allergic/Immunologic: Negative for environmental allergies and food allergies.   Neurological: Positive for numbness (right index finger). Negative for dizziness, light-headedness and headaches.         Objective:    BP 113/42 (BP Site: Left arm, Patient Position: Sitting, Cuff Size: Medium)   Pulse (!) 58   Temp 98.4 F (36.9 C)   Resp 16   Ht 1.549 m (5\' 1" )   Wt 51.3 kg (113 lb)   LMP 01/26/2018 (Approximate)   Breastfeeding? Yes   BMI 21.35 kg/m     Physical Exam  Vitals signs and nursing note reviewed.   Constitutional:       General: She is not in acute distress.     Appearance: Normal  appearance.   HENT:      Head: Normocephalic and atraumatic.      Right Ear: External ear normal.      Left Ear: External ear normal.      Nose: Nose normal.      Mouth/Throat:      Mouth: Mucous membranes are moist.      Pharynx: Oropharynx is clear.   Eyes:      Conjunctiva/sclera: Conjunctivae normal.      Pupils: Pupils are equal, round, and reactive to light.   Neck:      Musculoskeletal: Normal range of motion and neck supple.   Cardiovascular:      Rate and Rhythm: Regular rhythm. Bradycardia present.   Pulmonary:      Effort: Pulmonary effort is normal.      Breath sounds: Normal breath sounds.   Musculoskeletal:      Right hand: She exhibits decreased range of motion, tenderness, laceration and swelling.        Hands:    Lymphadenopathy:      Cervical: No cervical adenopathy.   Skin:     General: Skin is warm.   Neurological:      Mental Status: She is alert and oriented to person, place, and time.   Psychiatric:  Mood and Affect: Mood normal.         Behavior: Behavior normal.           Assessment and Plan:       Carlisha was seen today for laceration.    Diagnoses and all orders for this visit:    Laceration of right hand with infection, initial encounter  -     cephalexin (KEFLEX) 500 MG capsule; Take 1 capsule (500 mg total) by mouth 4 (four) times daily for 7 days    Take full course of medication as prescribed.  Keep wound clean and pat dry.  Do not keep hand submerged in water.  Showering is fine. Tylenol/Ibuprofen for pain.  Follow up with hand surgeon per ED instructions.  Return to UC for suture removal in 5 days or sooner for worsening symptoms. Pt/guardian acknowledges understanding and concurs with plan of care.    Pt HR low.  Pt states she feels fine and that her HR always runs low. Will follow up with PCP.  No immediate intervention needed at this time.      Parke Poisson, PA-C  [facility]  02/09/2018  12:47 PM

## 2018-02-11 ENCOUNTER — Telehealth (INDEPENDENT_AMBULATORY_CARE_PROVIDER_SITE_OTHER): Payer: Self-pay

## 2018-02-11 NOTE — Telephone Encounter (Signed)
Called Crystal Sweeney left message following up regarding recent visit and to return our call if they would like.  [name] 12:22 PM

## 2019-02-15 ENCOUNTER — Other Ambulatory Visit: Payer: Managed Care, Other (non HMO) | Attending: Family

## 2019-02-15 ENCOUNTER — Other Ambulatory Visit: Payer: Self-pay

## 2019-02-15 ENCOUNTER — Ambulatory Visit (INDEPENDENT_AMBULATORY_CARE_PROVIDER_SITE_OTHER): Payer: Managed Care, Other (non HMO) | Admitting: Family

## 2019-02-15 VITALS — BP 119/77 | HR 73 | Temp 99.3°F | Ht 61.0 in | Wt 120.0 lb

## 2019-02-15 DIAGNOSIS — Z7251 High risk heterosexual behavior: Secondary | ICD-10-CM

## 2019-02-15 DIAGNOSIS — R300 Dysuria: Secondary | ICD-10-CM

## 2019-02-15 DIAGNOSIS — N3001 Acute cystitis with hematuria: Secondary | ICD-10-CM

## 2019-02-15 DIAGNOSIS — F17210 Nicotine dependence, cigarettes, uncomplicated: Secondary | ICD-10-CM

## 2019-02-15 DIAGNOSIS — Z6822 Body mass index (BMI) 22.0-22.9, adult: Secondary | ICD-10-CM

## 2019-02-15 MED ORDER — NITROFURANTOIN MONOHYDRATE/MACROCRYSTALS 100 MG CAPSULE
100.00 mg | ORAL_CAPSULE | Freq: Two times a day (BID) | ORAL | 0 refills | Status: AC
Start: 2019-02-15 — End: 2019-02-22

## 2019-02-15 NOTE — Nursing Note (Signed)
BP 119/77   Pulse 73   Temp 37.4 C (99.3 F) (Oral)   Ht 1.549 m (5\' 1" )   Wt 54.4 kg (120 lb)   SpO2 97%   BMI 22.67 kg/m     Sharen Hint  02/15/2019, 17:20

## 2019-02-15 NOTE — Nursing Note (Signed)
02/15/19 1700   Urine Dipstick   Time collected 1705   Initials stb   Color Yellow   Clarity Clear   Leukocytes (!) 1+   Nitrite (!) Positive   Urobilinogen 1.0 mg/dL   Protein (!) 1+ (30mg /dL)   pH 7.0   Blood (urine) (!) Small (1+)   Urine Specific Gravity 1.025   Ketones (!) Large (80 mg/dl)   Bilirubin (!) 1+   Glucose Negative   Lot # [id number]   Expiration Date 12/12/19   Culture Sent yes

## 2019-02-15 NOTE — Progress Notes (Signed)
[address], [place]  [address]  [place] [address]    Name: Jeannemarie Sawaya  MRN: [id number]  DOB: [birth year]    Date of Service: 02/15/2019  Chief Complaint:   Chief Complaint            Urinary Pain x 5 days        HPI: Keamber Macfadden is a 23 y.o. female presenting today with complaints of dysuria and urinary frequency for the last 5 days.  She feels like she has a UTI, she has had these in the past a couple of times.  She tried Azo for 2 days which helped that is finished she stepped this the pain got worse.  LMP-"middle of October of 2020".  She denies concern for pregnancy but does report that she has been sexually active and is not on any birth control.  She denies any vomiting, fever, lower back pain.    Past Medical History  Current Outpatient Medications   Medication Sig   . nitrofurantoin (MACROBID) 100 mg Oral Capsule Take 1 Cap (100 mg total) by mouth Twice daily for 7 days     No Known Allergies  No past medical history on file.    Family Medical History:     None        Social History     Socioeconomic History   . Marital status: Single     Spouse name: Not on file   . Number of children: Not on file   . Years of education: Not on file   . Highest education level: Not on file   Tobacco Use   . Smoking status: Current Every Day Smoker     Packs/day: 0.50     Years: 3.00     Pack years: 1.50   Substance and Sexual Activity   . Alcohol use: Yes     Comment: occasional   . Drug use: No   . Sexual activity: Yes     Review of Systems  Constitutional: Denies fevers, chills, sweats.   GI: Denies any abdominal pain, nausea, vomiting.    GU: Denies any hematuria. + frequency, dysuria.   Skin: Denies any rash.      Physical Exam  General: No apparent acute distress.   Blood pressure 119/77, pulse 73, temperature 37.4 C (99.3 F), temperature source Oral, height 1.549 m (5\' 1" ), weight 54.4 kg (120 lb), SpO2 97 %.  Lungs: Speaking in full sentences, no cough.   Abdomen: No  guarding. Abdomen is soft. BS present. Non-tender to palpation. No rebound tenderness. No CVA or suprapubic tenderness.    Skin: Warm and dry.  No significant rashes or lesions.     Urine Dip Results:   Time collected: 1705  Glucose: Negative  Bilirubin: (!) Small  Ketones: (!) Large (80 mg/dl)  Urine Specific Gravity: 1.025  Blood (urine): (!) Small (1+)  pH: 7.0  Protein: (!) 1+ (30mg /dL)  Urobilinogen: 1.0 mg/dL  Nitrite: (!) Positive  Leukocytes: (!) Small     Assessment & Plan    ICD-10-CM    1. Acute cystitis with hematuria  N30.01 nitrofurantoin (MACROBID) 100 mg Oral Capsule   2. Dysuria  R30.0 POCT URINE DIPSTICK     URINE CULTURE   3. Unprotected sex  Z72.51 POCT URINE HCG   UA as above- treat for UTI with Macrobid 100 mg PO BID x7 days.   Discussed s/e.  Increase fluid intake.  Will send urine culture.  If fever, back pain, return to clinic/go to ER.   Urine HCG negative.     Antibiotics as above.  Patient/parent educated to complete antibiotic course, despite resolution of symptoms.    Discussed adequate hand washing, as well as the risk of C.  Difficile with taking antibiotics.  Patient/parent educated on the use of a probiotic or yogurt containing live bacteria while taking the antibiotic.  Discussed that there is a risk for allergic reaction and when to seek further care.      Plan was discussed and patient/parent verbalized understanding.  If symptoms worsen/ fail to improve as anticipated, advised patient/parent  to follow up with primary care or return to the Urgent Care for further evaluation.  Go to Emergency Department immediately for any immediate concerns.     Thurnell Lose, APRN  02/15/2019, 17:23    My Supervising Physician today is: Dr. Wyn Quaker

## 2019-02-18 LAB — URINE CULTURE: URINE CULTURE: 100000 — AB

## 2019-02-18 NOTE — Progress Notes (Signed)
[place]  Your  urine  culture  grew bacteria sensitive  to antibiotic you are on , continue the current antibiotic & complete the course as recommended.

## 2019-04-14 NOTE — L&D Delivery Note (Signed)
[facility]    Delivery Summary Note      Name: Shari Lawson   MRN: [id number]  DOB:  1995-11-20  Admitted: 11/27/2019  5:11 PM       Pre-delivery diagnosis:      24 y.o. G2P1001 at [redacted]w[redacted]d gestation. Term pregnancy and Single fetus, rh negative, insufficent prenatal care.    Post-delivery diagnosis:  Same + delivery of a viable infant.    Findings:           Delivery of viable term infan. Infant suctioned. Cord clamped x2 and cut. Cord Segment collected. Infant to maternal abdomen.  Cord blood collected.  Placenta delivered. Good hemostasis noted. See below for delivery details. 2nd degree laceration repaired    Disposition:  Infant(s) admitted to floor (rooming in with mother). Pediatric team at bedside for delivery.    "Delivery Information"      IO Blood Loss  11/27/19 1905 - 11/28/19 0905      None                 [name], [name] [[id number]]      Labor Events    Preterm labor?: No  Antenatal steroids: None  Cervical Ripening: 11/17/2019 1826  Cervical ripening type: Misoprostol  Antibiotics received during labor?: No  Rupture date/time: 11/28/2019 0415  Rupture type: Artificial  Fluid color: Clear  Augmentation: Oxytocin, AROM  Augmentation date/time: 11/28/2019 0505  Indications for augmentation: Ineffective Contraction Pattern              Delivery Anesthesia    Method: Epidural       Assisted Delivery Details    Forceps attempted?: No  Vacuum extractor attempted?: No       Shoulder Dystocia Maneuvers    Shoulder dystocia present?: No       Lacerations      Episiotomy: None   Perineal lacerations: 2nd Repaired: Yes     Vaginal laceration: Yes Repaired: Yes   Repair suture: 3-0 Vicryl       Delivery Information    Birth date/time: 11/28/2019 0705  Sex: Female  Delivery type: Vaginal, Spontaneous       Newborn Presentation    Presentation: Vertex  Position: Left Occiput Anterior       Cord Information    Vessels: 3 Vessels  Complications: None  Delayed cord clamping?: Yes  Cord clamped  date/time: 11/28/2019 0706  Cord blood disposition: Lab  Gases sent?: No       Resuscitation    Method: None       Newborn  Apgars    Living status: Living      Skin color:    Heart rate:    Reflex Irrit:    Muscle tone:    Resp. effort:    Total:     1 Min:    1    2    2    2    1    8     5  Min:    1    2    2    2    2    9     10  Min:     15 Min:     20 Min:       Apgars assigned by: A MARTIN RN       Skin to Skin    Skin to skin initiation: 11/28/2019 0705  Skin to skin with: Mother  Skin to  skin end: 11/28/2019 0750  Breastfeeding initiated: 11/28/2019 0740       Newborn Measurements    Weight: 3170 g  Length: 48.3 cm  Head circumference: 13.5 cm  Chest circumference: 12.5 cm  Abdominal girth: 12 cm       Placenta    Date/time: 11/28/2019 0709  Removal: Spontaneous  Appearance: Intact  Disposition: Held for future pathology       Other Delivery Procedures    Procedures: None       Delivery Providers      Delivering Clinician: Ceasar Mons, MD   Provider Role   Darr, Judeth Cornfield, RN Registered Nurse   See, Delman Cheadle, RN Baby Nurse   Alexis Goodell, MD Resident                   Labor Length      3rd stage: 0h 46m                 Alexis Goodell, MD    I saw and examined the patient and was present through the full delivery with and reviewed the Residents'/Fellows' notes, agree with the findings and plan of care as documented in the Residents/Fellows notes.  I have made and approve of any additions and/or edits to the note.  Ceasar Mons, MD

## 2019-04-20 ENCOUNTER — Ambulatory Visit (INDEPENDENT_AMBULATORY_CARE_PROVIDER_SITE_OTHER): Payer: Self-pay | Admitting: Student in an Organized Health Care Education/Training Program

## 2019-05-03 ENCOUNTER — Other Ambulatory Visit: Payer: Self-pay

## 2019-05-04 ENCOUNTER — Encounter (INDEPENDENT_AMBULATORY_CARE_PROVIDER_SITE_OTHER): Payer: Self-pay | Admitting: GENERAL

## 2019-05-04 ENCOUNTER — Ambulatory Visit (INDEPENDENT_AMBULATORY_CARE_PROVIDER_SITE_OTHER): Payer: Managed Care, Other (non HMO) | Admitting: GENERAL

## 2019-05-04 VITALS — BP 129/66 | HR 80 | Temp 97.1°F | Resp 18 | Ht 61.0 in | Wt 117.8 lb

## 2019-05-04 DIAGNOSIS — W260XXS Contact with knife, sequela: Secondary | ICD-10-CM

## 2019-05-04 DIAGNOSIS — S6991XS Unspecified injury of right wrist, hand and finger(s), sequela: Secondary | ICD-10-CM

## 2019-05-04 DIAGNOSIS — S56191S Other injury of flexor muscle, fascia and tendon of right index finger at forearm level, sequela: Secondary | ICD-10-CM

## 2019-05-04 DIAGNOSIS — F320 Major depressive disorder, single episode, mild: Secondary | ICD-10-CM

## 2019-05-04 DIAGNOSIS — Z87891 Personal history of nicotine dependence: Secondary | ICD-10-CM

## 2019-05-04 DIAGNOSIS — S61210S Laceration without foreign body of right index finger without damage to nail, sequela: Secondary | ICD-10-CM

## 2019-05-04 MED ORDER — SERTRALINE 25 MG TABLET
25.0000 mg | ORAL_TABLET | Freq: Every day | ORAL | 1 refills | Status: DC
Start: 2019-05-04 — End: 2022-02-16

## 2019-05-04 NOTE — Nursing Note (Signed)
Chief Complaint:   Chief Complaint            Physical New Patient        Functional Health Screen  Functional Health Screening:        BP 129/66   Pulse 80   Temp 36.2 C (97.1 F) (Oral)   Resp 18   Ht 1.549 m (5\' 1" )   Wt 53.4 kg (117 lb 12.8 oz)   LMP 03/28/2019 (Within Weeks)   SpO2 95%   BMI 22.26 kg/m       Social History     Tobacco Use   Smoking Status Current Every Day Smoker   . Packs/day: 0.50   . Years: 3.00   . Pack years: 1.50     Patient Health Rating     In general, would you say your health is:: Excellent (9-10)  How confident are you that you can control and manage most of your health problems ?: Very Confident  Depression Screening  PHQ Questionnaire  Little interest or pleasure in doing things.: Not at all  Feeling down, depressed, or hopeless: Not at all  PHQ 2 Total: 0  Allergies:  No Known Allergies  Medication History  Reviewed for OTC medication and any new medications, provider will review medication history  Results through Enter/Edit  No results found for this or any previous visit (from the past 24 hour(s)).  POCT Results  Care Team  Patient Care Team:  [phone], DO as PCP - General (FAMILY MEDICINE)  Immunizations - last 24 hours     None        Maggie Font, Janeann Forehand  05/04/2019, 11:01

## 2019-05-04 NOTE — H&P (Deleted)
[facility]  [address]  [place]  [address]  Dept: [phone]  Loc: [phone]  Loc Fax: [phone]   Name: Shari Lawson MRN:  [id number]   Date: 05/04/2019 DOB: [birth year]                         Chief complaint: No chief complaint on file.      Subjective:  History of Presenting Illness:   ***    Past Medical History:  There is no problem list on file for this patient.    Medications:   No current outpatient medications on file.     No current facility-administered medications for this visit.      Allergies:   No Known Allergies    Family History:   Family Medical History:     None              Social History:  ***      Review of Systems          Objective    Physical Exam:    Vitals:   There were no vitals taken for this visit.        General:  patient is pleasant and in no acute distress; appears stated age; ***habitus  HEENT:  Head:  normocephalic; atraumatic; no cranial bone tenderness upon palpation; no frontal or maxillary tenderness upon palpation    Eyes: PERRLA; EOM intact   Ears: no tragus tenderness; light reflex intact bilaterally; ***   Nose:  no septal deviation or erythema    Throat:  moist mucous membranes; no erythema  Neck: neck supple; trachea midline  Cardiovascular:  RRR, normal S1/S2 with no gallop or rubs; no murmur  Respiratory:  CTA bilaterally; no crackles or wheezes   Abdomen: soft; non-distended; normoactive bowel sounds in all 4 quadrants; no tenderness to palpation in all 4 quadrants  Extremities: no LE edema bilaterally; no cyanosis bilaterally; +2/4 pedal pulses bilaterally; +2/4 posterior tibial pulses bilaterally   Musculoskeletal:***  OSE:***  Skin: moist; normal turgor; no ecchymosis noted in UE, LE, or abdomen  Neurologic: alert and oriented X3; grossly normal   Lymphatic:  no cervical, preauriclar, postauricular, submandibular, sublingual, submental, or supraclavicular lymphadenopathy  GU:  External  Exam: Normal female genitalia. No urethral tenderness.  No genital lesions present.    Vaginal Exam:  Normal to exam. Moist and pink vaginal mucosa, no abnormal discharge.   Bladder:  Normal to exam. Suprapubic fullness not present.    Uterus: Normal to exam. Normal size.  Cervix is normal to exam. No cervical discharge and no cervical motion tenderness. Contour:  Regular.  Mobility:  Mobile.    Adnexa:  No palpable masses and no tenderness bilaterally.   Breast: no masses or skin changes noted; no axillary LAD bilaterally   Psychiatric:  very pleasant and talkative; appears in good hygiene and appropriate mood      Assessment/Plan      Shari Lawson is a 24 y.o. female who presents to clinic today with cc of ***  ***    1.  ***     There are no diagnoses linked to this encounter.     Healthcare Maintenance  *Pap smear - *** (stop at 65)  *Mammogram - *** (up to 74)  *DEXA - ***  *Colon Cancer Screening - *** (stop at 75)  *Immunizations (Tdap, flu, Pneumovax, Prevnar, Zoster) - ***  *Diet/exercise  improvements/goals discussed - ***  *Diabetes screening - ***  *Lipid screening - ***  *STD screening - ***  *Hep C screening - ***  *HIV screening - ***  *Tobacco/Alcohol/Drug use - ***  *Lung cancer screen - *** (age 73-80, 30 pack-yr+, current smoker or quit within last 15 yrs)  *Depression screen - ***  *Contraception - ***  *Aspirin - *** (age 31-69 if >10% ASCVD)  *AAA screen - *** (man aged 3-75 who have ever smoked)    Other issues:  ***    Level of Service:***  Return to clinic: No follow-ups on file.      Evaluated patient and discussed treatment plan with attending physician, Dr. Silas Flood.     Alexis Goodell, MD 05/04/2019, 08:34  PGY-2  [facility]

## 2019-05-04 NOTE — Progress Notes (Signed)
[facility]  [address]  [place] [address]  Dept: [phone]  Loc: [phone]  Loc Fax: [phone]   Name: Shari Lawson MRN:  [id number]   Date: 05/04/2019 DOB: [birth year]                         Chief complaint:   Chief Complaint   Patient presents with    Physical     New Patient       Subjective:  History of Presenting Illness:   Shari Lawson is a 24 yo who presents with the inabilty to flex her left index finger for the past 2 years following trauma with knife while carving a pumpkin.  She is right handed.   Increased depression anxiety. She feels overwhelmed. Denies suicidal or homicidal thoughts. Patient can not recall her prior mood medication.  Appetite is low.  Sleep is not an issue. No nausea or vomiting.     Past Medical History:  Patient Active Problem List   Diagnosis    Laceration of right index finger with tendon involvement    Mild major depression (CMS HCC)     Medications:   Current Outpatient Medications   Medication Sig Dispense Refill    sertraline (ZOLOFT) 25 mg Oral Tablet Take 1 Tab (25 mg total) by mouth Once a day for 30 days 30 Tab 1     No current facility-administered medications for this visit.     Allergies:   No Known Allergies    Family History:   Family Medical History:       None          Social History:  Living with parents.  Former smoker. No current alcohol use.     Review of Systems   Constitutional: Negative for activity change, appetite change, chills and fever.   HENT: Negative for congestion and sore throat.    Respiratory: Negative for cough and shortness of breath.    Cardiovascular: Negative for chest pain, palpitations and leg swelling.   Gastrointestinal: Negative for abdominal pain, constipation and diarrhea.   Musculoskeletal:        Inability to flex left index finger   Psychiatric/Behavioral: Positive for dysphoric mood and sleep disturbance. Negative for decreased concentration, self-injury  and suicidal ideas. The patient is not nervous/anxious.      Objective    Physical Exam:    Vitals:   BP 129/66   Pulse 80   Temp 36.2 C (97.1 F) (Oral)   Resp 18   Ht 1.549 m (5\' 1" )   Wt 53.4 kg (117 lb 12.8 oz)   LMP 03/28/2019 (Within Weeks)   SpO2 95%   BMI 22.26 kg/m     General:  patient is pleasant and in no acute distress; appears stated age  68:  Head:  normocephalic; atraumatic    Eyes: PER; EOM intact   Nose:  no septal deviation or erythema    Throat:  moist mucous membranes; no erythema  Neck: neck supple; trachea midline  Cardiovascular:  RRR, normal S1/S2 with no gallop or rubs; no murmur  Respiratory:  CTA bilaterally; no crackles or wheezes   Abdomen: soft; non-distended; normoactive bowel sounds in all 4 quadrants; no tenderness to palpation in all 4 quadrants  Extremities: no LE edema bilaterally; no cyanosis bilaterally; +2/4 pedal pulses bilaterally; +2/4 posterior tibial pulses bilaterally   MSK: Inability to flex dip and pip  joint of right index finger.  Skin: moist; normal turgor; no ecchymosis noted in UE, LE, or abdomen  Neurologic: alert and oriented X3; grossly normal   Lymphatic:  no cervical, preauriclar, postauricular, submandibular, sublingual, submental, or supraclavicular lymphadenopathy  Psychiatric:  very pleasant and talkative; appears in good hygiene and appropriate mood      Assessment/Plan  Shari Lawson is a 25 y.o. female who presents to clinic today with cc of inability to flex right index finger and changes in mood.    1. Laceration of right index finger with tendon involvement, sequela  -     Refer to Occupational Therapy-External; Future  -     Refer to [employer]; Future    2. Mild major depression (CMS HCC)  -     sertraline (ZOLOFT) 25 mg Oral Tablet; Take 1 Tab (25 mg total) by mouth Once a day for 30 days    Level of Service: N3  Return to clinic: Return in about 1 month (around 06/04/2019) for mood check.    Evaluated  patient and discussed treatment plan with attending physician, Dr. Silas Flood.    Alexis Goodell, MD 05/04/2019, 11:24  PGY-2  [facility]    I discussed management, reviewed the note and agree with the documented findings and plan of care except as noted below:    Liliane Channel, MD  05/30/2019, 15:00

## 2019-05-05 ENCOUNTER — Telehealth (INDEPENDENT_AMBULATORY_CARE_PROVIDER_SITE_OTHER): Payer: Self-pay | Admitting: Family Medicine

## 2019-05-05 NOTE — Telephone Encounter (Signed)
I have pended you the internal order for OT if you could please advise.    Thank you!    Ladona Horns  05/05/2019, 11:13

## 2019-05-12 ENCOUNTER — Other Ambulatory Visit: Payer: Self-pay

## 2019-05-12 NOTE — Telephone Encounter (Signed)
Why are we doing OT.     Liliane Channel, MD  05/12/2019, 16:01

## 2019-05-17 ENCOUNTER — Ambulatory Visit (INDEPENDENT_AMBULATORY_CARE_PROVIDER_SITE_OTHER): Payer: Managed Care, Other (non HMO)

## 2019-05-17 ENCOUNTER — Other Ambulatory Visit: Payer: Self-pay

## 2019-05-17 DIAGNOSIS — Z3200 Encounter for pregnancy test, result unknown: Secondary | ICD-10-CM

## 2019-05-17 DIAGNOSIS — N912 Amenorrhea, unspecified: Secondary | ICD-10-CM

## 2019-05-17 NOTE — Nursing Note (Signed)
05/17/19 1600   Required: Location Test Performed At:   [facility] [address] [place], [place] [address]   HCG   Urine HCG Positive   Rapid HCG Lot# [id number]   Expiration Date 09/10/20   Internal Control Valid yes   Initials rw

## 2019-05-17 NOTE — Nursing Note (Signed)
Patient came in for urine pregnancy test. Test is positive and patient is unsure if she will complete pregnancy or her LMP. I told her that I will call next week to follow up/ schedule appointment after patient has weighed her options.    Elon Spanner, CMA  05/17/2019, 16:12

## 2019-05-21 DIAGNOSIS — S61210A Laceration without foreign body of right index finger without damage to nail, initial encounter: Secondary | ICD-10-CM | POA: Insufficient documentation

## 2019-05-21 DIAGNOSIS — F331 Major depressive disorder, recurrent, moderate: Secondary | ICD-10-CM | POA: Insufficient documentation

## 2019-05-21 DIAGNOSIS — F320 Major depressive disorder, single episode, mild: Secondary | ICD-10-CM | POA: Insufficient documentation

## 2019-05-21 HISTORY — DX: Laceration without foreign body of right index finger without damage to nail, initial encounter: S61.210A

## 2019-06-06 ENCOUNTER — Encounter (INDEPENDENT_AMBULATORY_CARE_PROVIDER_SITE_OTHER): Payer: Self-pay | Admitting: GENERAL

## 2019-06-07 ENCOUNTER — Ambulatory Visit (HOSPITAL_BASED_OUTPATIENT_CLINIC_OR_DEPARTMENT_OTHER): Payer: Self-pay | Admitting: Plastic Surgery

## 2019-07-31 ENCOUNTER — Encounter (INDEPENDENT_AMBULATORY_CARE_PROVIDER_SITE_OTHER): Payer: Self-pay | Admitting: GENERAL

## 2019-07-31 NOTE — Nursing Note (Signed)
Called and left message for call back.  Need to schedule ob work up.    Kristen Loader Karan Inclan, CMA  07/31/2019, 12:13

## 2019-08-01 NOTE — Nursing Note (Signed)
Called again to schedule ob work up.    Lynder Parents, CMA  08/01/2019, 15:27

## 2019-08-16 ENCOUNTER — Other Ambulatory Visit: Payer: Self-pay

## 2019-08-16 ENCOUNTER — Other Ambulatory Visit: Payer: Medicaid Other | Attending: Family Medicine

## 2019-08-16 ENCOUNTER — Encounter (INDEPENDENT_AMBULATORY_CARE_PROVIDER_SITE_OTHER): Payer: Self-pay | Admitting: Student in an Organized Health Care Education/Training Program

## 2019-08-16 ENCOUNTER — Ambulatory Visit (INDEPENDENT_AMBULATORY_CARE_PROVIDER_SITE_OTHER): Payer: Medicaid Other | Admitting: Student in an Organized Health Care Education/Training Program

## 2019-08-16 VITALS — BP 114/72 | HR 74 | Temp 98.2°F | Resp 16 | Ht 61.0 in | Wt 124.0 lb

## 2019-08-16 DIAGNOSIS — Z3492 Encounter for supervision of normal pregnancy, unspecified, second trimester: Secondary | ICD-10-CM

## 2019-08-16 DIAGNOSIS — Z3482 Encounter for supervision of other normal pregnancy, second trimester: Secondary | ICD-10-CM

## 2019-08-16 DIAGNOSIS — Z3A24 24 weeks gestation of pregnancy: Secondary | ICD-10-CM

## 2019-08-16 MED ORDER — AMOXICILLIN 875 MG TABLET
875.0000 mg | ORAL_TABLET | Freq: Two times a day (BID) | ORAL | 0 refills | Status: AC
Start: 2019-08-16 — End: 2019-08-20

## 2019-08-16 NOTE — Patient Instructions (Signed)
Apps for Pregnancy Monitoring    Please consider downloading apps such as "OviaPregnancy, Baby Center, or What to Expect" to help track your pregnancy. Each of these apps are similar in that you can enter your Expected Date of Delivery and follow the growth and development of your baby-to-be. Each app also offers similar health tips/advice to help navigate your pregnancy and can connect you with other pregnant moms-to-be who may be experiencing similar joys and discomforts of pregnancy.   OviaPregnancy  OviaPregnancy is great for tracking data like blood pressures, weight gain, sleep, steps. It can also let you know what medications and foods are safe for consumption. You may also keep a calendar to help you track appointments, symptoms of pregnancy and other data collection.   "Baby Center" and "What to Expect"  These apps are similar in that they have many links to helpful articles for healthy recipes and explanations of common symptoms of pregnancy. They also have an "is it safe" application, but do not offer as much in the way of tracking as OviaPregnancy. These apps also have more access to tips on pregnancy not dealing with health - such as registry checklists and baby names. The "What to Expect" app is great to consider if you are already reading the book What to expect when you're expecting.

## 2019-08-16 NOTE — Progress Notes (Signed)
[facility]  [address]  [place] [address]  [phone]    Prenatal Encounter 20-28 [facility]      Name: Shari Lawson MRN:  [id number]   Date: 08/16/2019 DOB: 31-May-1995             EDD: Estimated Date of Delivery: None noted.     SUBJECTIVE:  This is a 24 year old G2P1 here today for initial prenatal encounter. She has not received prenatal care yet. She states her LMP was 02/22/19. She states she is anxious but excited about the pregnancy. She states she is nervous to tell her parents. She states she is not in a relationship with the father of the baby, and is not sure if he will support her. She notes she is unemployed currently.     Patient has one daughter currently. No hx of miscarriages. Her last pap smear was over 3 years ago.     Patient reports blurry vision occasionally. States vision look like static momentarily, and then back to normal. Episode lasted about less than a minute. No associated headaches with vision changes.    She reports one episode of vaginal bleeding. States she noticed it when she wiped her vaginal area once, and has not had it since then.     Patient states she occasionally has nausea usually in the morning, but states she does not vomit. States the nausea subsides when she moves slowly in the morning.     Blurry vision: yes  Headache: no  Vaginal bleeding or discharge: yes  Fetal movement: yes  Swelling/edema: no    Current Pregnancy History:  Labs ordered:   Blood Type:  CBC:  RPR:   HIV:  HbsAg:  HCV:  Rubella:   Gonorrhea:  Chlamydia:     Medications:  Current Outpatient Medications   Medication Sig    sertraline (ZOLOFT) 25 mg Oral Tablet Take 1 Tab (25 mg total) by mouth Once a day for 30 days        OBJECTIVE:  BP 114/72 (Site: Right, Patient Position: Sitting, Cuff Size: Adult)    Pulse 74    Temp 36.8 C (98.2 F) (Oral)    Resp 16    Ht 1.549 m (5\' 1" )    Wt 56.2 kg (124 lb)    LMP 02/22/2019 (Approximate)     SpO2 99%    BMI 23.43 kg/m        General:  patient is pleasant and in no acute distress; appears stated age;   HEENT:  Head:  normocephalic; atraumatic;   Eyes: PERR; EOM intact   Throat:  moist mucous membranes;   Neck: neck supple; trachea midline  Cardiovascular:  RRR, normal S1/S2 with no gallop or rubs; no murmur  Respiratory:  CTA bilaterally; no crackles or wheezes   Abdomen: soft; gravid   Extremities: no LE edema bilaterally; +2/4 pedal pulses bilaterally; +2/4 posterior tibial pulses bilaterally   Skin: moist; normal turgor;   Neurologic: alert and oriented X3; grossly normal   Psychiatric:  very pleasant and talkative; appears in good hygiene and appropriate mood  OB exam:  Fundal height: size appropriate for dates  FHTs: 157  Edema: none      Prenatal Labs:  Blood Type: A-    1-hr GTT: ordered      Urine Dip Results:   Time collected: 1539  Glucose: Negative  Bilirubin: Negative  Ketones: (!) Trace (5 mg/dl)  Urine Specific Gravity: 1.025  Blood (urine): Negative  pH: 6.0  Protein: Negative  Urobilinogen: 1.0 mg/dL  Nitrite: Negative  Leukocytes: (!) Large          ASSESSMENT and PLAN:  24 y.o. G2P1001 with Estimated Date of Delivery: 11/29/19. here for routine prenatal care. Amoxicillin ordered for asymptomatic bacteriuria and culture sent.     Pregnancy at [redacted] weeks Gestation  -Ultrasound ordered   -FHT heard on exam  -Prenatal labs ordered   -Counseling:    -Warning signs: Bleeding, Cramping   -Immunizations   -Appropriate weight gain   -Sexual Activity   -Work Modification   -Risk analyst Use   -Recommended birthing/parenting classes   -Encouraged breastfeeding  -RTC 4 weeks    Evaluated patient and discussed treatment plan with attending physician, Dr. Kirtland Bouchard.     Edison Simon, DO 08/16/2019, 14:59  PGY-1   [facility]      I saw and examined the patient.  I reviewed the resident's note.  I agree with the findings and plan of care as documented in the resident's note.   Any exceptions/additions are edited/noted.    Nancy Nordmann, MD

## 2019-08-16 NOTE — Nursing Note (Signed)
Chief Complaint:   Chief Complaint            Routine Prenatal Visit         Functional Health Screen  Functional Health Screening:        Ht 1.549 m (5\' 1" )    LMP 02/22/2019 (Approximate)    BMI 22.26 kg/m       Social History     Tobacco Use   Smoking Status Current Every Day Smoker    Packs/day: 0.50    Years: 3.00    Pack years: 1.50   Smokeless Tobacco Former 13/02/2019    Quit date: 04/13/2016     Patient Health Rating           Depression Screening  PHQ Questionnaire     Allergies:  No Known Allergies  Medication History  Reviewed for OTC medication and any new medications, provider will review medication history  Results through Enter/Edit  No results found for this or any previous visit (from the past 24 hour(s)).  POCT Results  Care Team  Patient Care Team:  06/11/2016, MD as PCP - General (GENERAL)  Immunizations - last 24 hours     None        Yeraldin Litzenberger, 03-08-1996  08/16/2019, 14:42

## 2019-08-16 NOTE — Nursing Note (Signed)
08/16/19 1500   Urine test  (Siemens Multistix 10 SG)   Time collected 1539   Color Yellow   Clarity Cloudy   Glucose Negative   Bilirubin Negative   Ketones (!) Trace (5 mg/dl)   Urine Specific Gravity 1.025   Blood (urine) Negative   pH 6.0   Protein Negative   Urobilinogen 1.0 mg/dL   Nitrite Negative   Leukocytes (!) 3+   Performed Status Automated   Bottle Number   (Siemens Multistix 10 SG) [id number]   Lot # [id number]   Expiration Date 07/12/19   Initials jls

## 2019-08-18 LAB — URINE CULTURE: URINE CULTURE: <10000 — AB

## 2019-08-19 ENCOUNTER — Other Ambulatory Visit (INDEPENDENT_AMBULATORY_CARE_PROVIDER_SITE_OTHER): Payer: Self-pay | Admitting: Family Medicine

## 2019-08-19 ENCOUNTER — Ambulatory Visit
Admission: RE | Admit: 2019-08-19 | Discharge: 2019-08-19 | Disposition: A | Discharge status: Home / Self Care | Payer: Medicaid Other | Source: Ambulatory Visit | Attending: Family Medicine | Admitting: Family Medicine

## 2019-08-19 ENCOUNTER — Other Ambulatory Visit: Payer: Self-pay

## 2019-08-19 DIAGNOSIS — Z3492 Encounter for supervision of normal pregnancy, unspecified, second trimester: Secondary | ICD-10-CM | POA: Insufficient documentation

## 2019-08-21 NOTE — Addendum Note (Signed)
Addended byNancy Nordmann on: 08/21/2019 05:39 PM     Modules accepted: Level of Service

## 2019-09-05 ENCOUNTER — Encounter (INDEPENDENT_AMBULATORY_CARE_PROVIDER_SITE_OTHER): Payer: Self-pay | Admitting: Student in an Organized Health Care Education/Training Program

## 2019-09-26 ENCOUNTER — Ambulatory Visit (INDEPENDENT_AMBULATORY_CARE_PROVIDER_SITE_OTHER): Payer: Medicaid Other | Admitting: Student in an Organized Health Care Education/Training Program

## 2019-09-26 ENCOUNTER — Other Ambulatory Visit: Payer: Self-pay

## 2019-09-26 ENCOUNTER — Encounter (INDEPENDENT_AMBULATORY_CARE_PROVIDER_SITE_OTHER): Payer: Self-pay | Admitting: Student in an Organized Health Care Education/Training Program

## 2019-09-26 VITALS — BP 100/60 | HR 119 | Temp 98.5°F | Resp 16 | Ht 60.5 in | Wt 128.0 lb

## 2019-09-26 DIAGNOSIS — Z3490 Encounter for supervision of normal pregnancy, unspecified, unspecified trimester: Secondary | ICD-10-CM

## 2019-09-26 DIAGNOSIS — Z3A30 30 weeks gestation of pregnancy: Secondary | ICD-10-CM

## 2019-09-26 DIAGNOSIS — Z23 Encounter for immunization: Secondary | ICD-10-CM

## 2019-09-26 DIAGNOSIS — Z3493 Encounter for supervision of normal pregnancy, unspecified, third trimester: Secondary | ICD-10-CM

## 2019-09-26 NOTE — Nursing Note (Signed)
09/26/19 1900   Required: Location Test Performed At:   [facility] [address], [place], [place] [address]   Urine test  (Siemens Multistix 10 SG)   Time collected 1936   Color Yellow   Clarity Clear   Glucose Negative   Bilirubin Negative   Ketones Negative   Urine Specific Gravity 1.010   Blood (urine) (!) Trace   pH 7.0   Protein Negative   Urobilinogen 0.2mg /dL (Normal)   Nitrite Negative   Leukocytes (!) 1+   Performed Status Automated   Bottle Number   (Siemens Multistix 10 SG) [id number]   Lot # [id number]   Expiration Date 10/10/20   Initials rw

## 2019-09-26 NOTE — Progress Notes (Signed)
[facility]  [address]  [place] [address]  [phone]    Prenatal Encounter 28-36 [facility]    Name: Shari Lawson MRN:  [id number]   Date: 09/26/2019 DOB: 05-Feb-1996             EDD: Estimated Date of Delivery: 11/29/19     SUBJECTIVE:    Pt without concerns. Excited that the baby is a boy.    Patient states she is worried about delivering at [facility] due to stories she has heard about patients. Interested in a water birth.     Contractions:no   Vaginal bleeding or discharge: no  Leakage of fluid: no  Fetal movement: yes      Current Pregnancy History: has not done labs yet. Patient encouraged strongly to get these done ASAP.   Blood Type:  CBC:  RPR:   HIV:  HbsAg:  HCV:  Rubella:   Gonorrhea:  Chlamydia:     Medications:  Current Outpatient Medications   Medication Sig    sertraline (ZOLOFT) 25 mg Oral Tablet Take 1 Tab (25 mg total) by mouth Once a day for 30 days        OBJECTIVE:  BP 100/60    Pulse (!) 119    Temp 36.9 C (98.5 F) (Oral)    Resp 16    Ht 1.537 m (5' 0.5")    Wt 58.1 kg (128 lb)    LMP 02/22/2019 (Approximate)    SpO2 98%    BMI 24.59 kg/m        General:  patient is pleasant and in no acute distress; appears stated age;   HEENT:  Head:  normocephalic; atraumatic;   Eyes: PERR; EOM intact   Throat:  moist mucous membranes;   Neck: neck supple; trachea midline  Cardiovascular:  RRR, normal S1/S2 with no gallop or rubs; no murmur  Respiratory:  CTA bilaterally; no crackles or wheezes   Abdomen: soft; gravid   Extremities: no LE edema bilaterally  Skin: moist; normal turgor;   Neurologic: alert and oriented X3; grossly normal   Psychiatric:  very pleasant and talkative; appears in good hygiene and appropriate mood  OB exam:  Fundal height: size appropriate for dates- 28 cm  FHTs: 150  Edema: none      Prenatal Labs: Reviewed    Urine Dip Results:   Time collected: 1936  Glucose: Negative  Bilirubin: Negative  Ketones:  Negative  Urine Specific Gravity: 1.010  Blood (urine): (!) Trace  pH: 7.0  Protein: Negative  Urobilinogen: 0.2mg /dL (Normal)  Nitrite: Negative  Leukocytes: (!) Small        ASSESSMENT and PLAN:  24 y.o. G2P1001 with Estimated Date of Delivery: 11/29/19 here for routine prenatal care at [redacted]w[redacted]d WGA.    Pt encouraged to follow with OB at [facility] or [facility] if that is where she wishes to deliver. Encouraged to get labs done ASAP.     Pregnancy at 30 Gestation  -Size appropriate for dates  -FHT heard on exam  -Prenatal labs reviewed.   -Counseling:    -Discussed labor precautions   -Awareness of fetal movement (kick counts, decreased fetal movement)   -When to come to L&D   -Pain control in labor   -Infant Care: Pediatrician: Seleta Rhymes, Circumcision- yes   -Contraception:   -Work   -Engineer, site  -RTC 2 weeks    Evaluated patient and discussed treatment plan with attending physician, Dr. Leonides Schanz.    /  Edison Simon, DO 09/26/2019, 19:52  PGY-1   [facility]          I independently saw and examined the patient.  We discussed the management of prenatal care at the time of the visit.  I agree with the assessment and plan.    Mickel Duhamel, MD 10/09/2019, 18:25

## 2019-09-26 NOTE — Patient Instructions (Addendum)
Adapting to Pregnancy: Second Trimester    Keep up the healthy habits you started in your first trimester. You might be a little more tired than normal. So plan your day wisely. Look at the tips below and choose the ones that suit your lifestyle.  If you have any questions, check with your healthcare provider.  If you work  If you can, adjust your work with your employer to fit your needs. Try these tips:   If you stand for long periods, find ways to do some tasks while sitting. Also, try to stand with 1 foot resting on a low stool or ledge. Shift your weight from foot to foot often. Wear low-heeled shoes.   If you sit, keep your knees level with your hips. Rest your feet on a firm surface. Sit tall with support for your low back.   If you work long hours, ask about adjusting your schedule. Try taking shorter breaks more often.  When you travel  The second trimester may be the best time for any travel. Talk to your healthcare provider about any special plans you may need to make. Always:   Wear a seat belt. Fasten the lap part under your belly. Wear the shoulder part also.   Take breaks often during long trips by car or plane. Move around to stretch your legs.   Drink plenty of fluids on flights. The air in plane cabins is very dry.   Avoid hot climates or high altitudes if you are not used to them.   Avoid places where the food and water might make you sick.   Make sure you are up-to-date on all immunizations, including the flu vaccine. This is especially important when traveling overseas.  Taking time to relax  Find time to rest and relax at work or at home:   Take short time-outs daily. Do relaxation exercises.   Breathe deeply during stressful times.   Try not to take on too much. Plan tasks for times when you have the most energy.   Take naps when you can. Or just sit and relax.   After week 16, avoid lying on your back for more than a few minutes. Instead, lie on your side. Switch sides often.   Continuing as lovers  Unless your healthcare provider tells you otherwise, there is no reason to stop having sex now. Blood supply increases to the pelvic area in the second trimester. Because of this, sex might be more enjoyable. Try different positions and see what's best. Also, talk to your partner about any changes in desire. Spotting may happen after sex. Be sure to let your healthcare provider know if there is heavy bleeding.  Keeping your environment safe  You can still clean house and use scented products. Just take some simple precautions:   Wear gloves when using cleaning fluids.   Open windows to let in fresh air. Use a fan if you paint.   Avoid secondhand smoke.   Don't breathe fumes from nail polish, hair spray, cleansers, or other chemicals.  StayWell last reviewed this educational content on 04/13/2016   2000-2021 [employer]. All rights reserved. This information is not intended as a substitute for professional medical care. Always follow your healthcare professional's instructions.      ..      Vaccine Information Statement    Tdap (Tetanus, Diphtheria, Pertussis) Vaccine: What you need to know     Many Vaccine Information Statements are available in Spanish and other  languages. See PromoAge.com.br  Hojas de informacin sobre vacunas estn disponibles en espaol y en muchos otros idiomas. Visite PromoAge.com.br    1. Why get vaccinated?    Tdap vaccine can prevent tetanus, diphtheria, and pertussis.    Diphtheria and pertussis spread from person to person. Tetanus enters the body through cuts or wounds.    ; TETANUS (T) causes painful stiffening of the muscles. Tetanus can lead to serious health problems, including being unable to open the mouth, having trouble swallowing and breathing, or death.   ; DIPHTHERIA (D) can lead to difficulty breathing, heart failure, paralysis, or death.   ; PERTUSSIS (aP), also known as "whooping cough," can cause uncontrollable, violent  coughing which makes it hard to breathe, eat, or drink. Pertussis can be extremely serious in babies and young children, causing pneumonia, convulsions, brain damage, or death.  In teens and adults, it can cause weight loss, loss of bladder control, passing out, and rib fractures from severe coughing.      2. Tdap vaccine     Tdap is only for children 7 years and older, adolescents, and adults.     Adolescents should receive a single dose of Tdap, preferably at age 81 or 12 years.     Pregnant women should get a dose of Tdap during every pregnancy, to protect the newborn from pertussis. Infants are most at risk for severe, life-threatening complications from pertussis.    Adults who have never received Tdap should get a dose of Tdap.      Also, adults should receive a booster dose every 10 years, or earlier in the case of a severe and dirty wound or burn.  Booster doses can be either Tdap or Td (a different vaccine that protects against tetanus and diphtheria but not pertussis).     Tdap may be given at the same time as other vaccines.    3. Talk with your health care provider    Tell your vaccine provider if the person getting the vaccine:  ; Has had an allergic reaction after a previous dose of any vaccine that protects against tetanus, diphtheria, or pertussis, or has any severe, life-threatening allergies.   ; Has had a coma, decreased level of consciousness, or prolonged seizures within 7 days after a previous dose of any pertussis vaccine (DTP, DTaP, or Tdap).   ; Has seizures or another nervous system problem.   ; Has ever had Guillain-Barr Syndrome (also called GBS).  ; Has had severe pain or swelling after a previous dose of any vaccine that protects against tetanus or diphtheria.     In some cases, your health care provider may decide to postpone Tdap vaccination to a future visit.    People with minor illnesses, such as a cold, may be vaccinated. People who are moderately or severely ill should usually  wait until they recover before getting Tdap vaccine.    Your health care provider can give you more information.    4. Risks of a vaccine reaction    ; Pain, redness, or swelling where the shot was given, mild fever, headache, feeling tired, and nausea, vomiting, diarrhea, or stomachache sometimes happen after Tdap vaccine.    People sometimes faint after medical procedures, including vaccination. Tell your provider if you feel dizzy or have vision changes or ringing in the ears.    As with any medicine, there is a very remote chance of a vaccine causing a severe allergic reaction, other serious injury, or death.  5. What if there is a serious problem?    An allergic reaction could occur after the vaccinated person leaves the clinic. If you see signs of a severe allergic reaction (hives, swelling of the face and throat, difficulty breathing, a fast heartbeat, dizziness, or weakness), call 9-1-1 and get the person to the nearest hospital.    For other signs that concern you, call your health care provider.    Adverse reactions should be reported to the Vaccine Adverse Event Reporting System (VAERS). Your health care provider will usually file this report, or you can do it yourself. Visit the VAERS website at www.vaers.LAgents.no or call 226-498-3080.  VAERS is only for reporting reactions, and VAERS staff do not give medical advice.    6. The National Vaccine Injury Compensation Program    The [employer] Vaccine Injury Compensation Program (VICP) is a federal program that was created to compensate people who may have been injured by certain vaccines. Visit the VICP website at SpiritualWord.at or call 9017823431 to learn about the program and about filing a claim. There is a time limit to file a claim for compensation.    7. How can I learn more?    ; Ask your health care provider.   ; Call your local or state health department.  ; Contact the Centers for Disease Control and Prevention (CDC):  -  Call 717-245-4411 (1-800-CDC-INFO) or  - Visit CDC's website at PicCapture.uy    Vaccine Information Statement (Interim)  Tdap (Tetanus, Diphtheria, Pertussis) Vaccine  07/13/2018  [place]  [phone]     Department of Health and Insurance risk surveyor for Disease Control and Prevention    Office Use Only

## 2019-09-26 NOTE — Nursing Note (Signed)
Chief Complaint:   Chief Complaint            Routine Prenatal Visit         Functional Health Screen  Functional Health Screening:        BP 100/60   Pulse (!) 119   Temp 36.9 C (98.5 F) (Oral)   Resp 16   Ht 1.537 m (5' 0.5")   Wt 58.1 kg (128 lb)   LMP 02/22/2019 (Approximate)   SpO2 98%   BMI 24.59 kg/m       Social History     Tobacco Use   Smoking Status Former Smoker   . Packs/day: 0.50   . Years: 3.00   . Pack years: 1.50   . Quit date: 06/27/2019   . Years since quitting: 0.2   Smokeless Tobacco Former Neurosurgeon   . Quit date: 04/13/2016     Patient Health Rating           Depression Screening  PHQ Questionnaire     Allergies:  No Known Allergies  Medication History  Reviewed for OTC medication and any new medications, provider will review medication history  Results through Enter/Edit  No results found for this or any previous visit (from the past 24 hour(s)).  POCT Results  Care Team  Patient Care Team:  Nancy Nordmann, MD as PCP - General (FAMILY MEDICINE)  Roxy Manns, [place]  Immunizations - last 24 hours     Date Immunization Status Dose Route/Site Given by    06/26/97 0000 VARICELLA VACCINE LIVE(ADMIN) Given       10/25/07 0000 VARICELLA VACCINE LIVE(ADMIN) Given       12/09/16 0000 Tetanus Toxoid/Diphtheria Toxoid/Acellular Pertussis Vaccine, Adsorbed (Tdap) Given .5 mL      07/02/08 0000 Tetanus/diptheria-td-adult(admin) Given       08/14/96 0000 Polio Family Given       02/06/97 0000 Polio Family Given       02/08/06 0000 Polio Family Given       10/24/96 0000 Polio Family Given       01/18/12 0000 Flumist (Nasal) Influenza Vaccine, 2-49 yrs Given       06/26/97 0000 MEASLES/MUMPS. RUBELLA VIRUS VACCINE(ADMIN) Given       02/08/01 0000 MEASLES/MUMPS. RUBELLA VIRUS VACCINE(ADMIN) Given       10/25/07 0000 Mening Family Given       04/25/13 0000 Meningococcal Vaccine (Menactra) Given       01/27/17 0000 AFLURIA-Influenza Vaccine (6-40mo)(Admin) Given .5 mL      12/23/10 0000 Influenza  Vaccine, 6 month-adult Given       10/25/07 0000 Hpv Family Given       05/31/08 0000 Hpv Family Given       01/24/08 0000 Hpv Family Given       08/14/96 0000 Hib Family Given       10/24/96 0000 Hib Family Given       10/01/97 0000 Hib Family Given       02/06/97 0000 Hib Family Given       10/23/05 0000 Polio Family Given       10/25/07 0000 Polio Family Given       1996-04-22 0000 Polio Family Given       02/06/97 0000 diptheria/tetanus/pertussis (infanrix/tripedia) (admin) Given       10/09/03 0000 diptheria/tetanus/pertussis (infanrix/tripedia) (admin) Given       08/14/96 0000 diptheria/tetanus/pertussis (infanrix/tripedia) (admin) Given       10/24/96 0000 diptheria/tetanus/pertussis (infanrix/tripedia) (admin) Given  10/01/97 0000 diptheria/tetanus/pertussis (infanrix/tripedia) (admin) Given       02/24/17 0000 RHOGAM Given 300 ug          Serita Kyle, MA  09/26/2019, 19:30

## 2019-10-11 ENCOUNTER — Encounter (INDEPENDENT_AMBULATORY_CARE_PROVIDER_SITE_OTHER): Payer: Self-pay | Admitting: GENERAL

## 2019-11-17 ENCOUNTER — Ambulatory Visit (INDEPENDENT_AMBULATORY_CARE_PROVIDER_SITE_OTHER): Payer: Medicaid Other | Admitting: GENERAL

## 2019-11-17 ENCOUNTER — Encounter (INDEPENDENT_AMBULATORY_CARE_PROVIDER_SITE_OTHER): Payer: Self-pay | Admitting: GENERAL

## 2019-11-17 ENCOUNTER — Other Ambulatory Visit: Payer: Medicaid Other | Attending: Family Medicine

## 2019-11-17 ENCOUNTER — Other Ambulatory Visit: Payer: Self-pay

## 2019-11-17 VITALS — BP 110/70 | HR 106 | Temp 97.2°F | Resp 16 | Ht 61.0 in | Wt 131.0 lb

## 2019-11-17 DIAGNOSIS — Z3A38 38 weeks gestation of pregnancy: Secondary | ICD-10-CM | POA: Insufficient documentation

## 2019-11-17 DIAGNOSIS — O0930 Supervision of pregnancy with insufficient antenatal care, unspecified trimester: Secondary | ICD-10-CM | POA: Insufficient documentation

## 2019-11-17 DIAGNOSIS — R8281 Pyuria: Secondary | ICD-10-CM | POA: Insufficient documentation

## 2019-11-17 DIAGNOSIS — O0933 Supervision of pregnancy with insufficient antenatal care, third trimester: Secondary | ICD-10-CM

## 2019-11-17 HISTORY — DX: Supervision of pregnancy with insufficient antenatal care, unspecified trimester: O09.30

## 2019-11-17 NOTE — Nursing Note (Signed)
Chief Complaint:   Chief Complaint            Routine Prenatal Visit         Functional Health Screen  Functional Health Screening:        BP 110/70 (Site: Left, Patient Position: Sitting, Cuff Size: Adult)   Pulse (!) 106   Temp 36.2 C (97.2 F) (Oral)   Resp 16   Ht 1.549 m (5\' 1" )   Wt 59.4 kg (131 lb)   LMP 02/22/2019 (Approximate)   SpO2 99%   BMI 24.75 kg/m       Social History     Tobacco Use   Smoking Status Former Smoker   . Packs/day: 0.50   . Years: 3.00   . Pack years: 1.50   . Quit date: 06/27/2019   . Years since quitting: 0.3   Smokeless Tobacco Former 06/29/2019   . Quit date: 04/13/2016     Patient Health Rating           Depression Screening  PHQ Questionnaire     Allergies:  No Known Allergies  Medication History  Reviewed for OTC medication and any new medications, provider will review medication history  Results through Enter/Edit  No results found for this or any previous visit (from the past 24 hour(s)).  POCT Results  Care Team  Patient Care Team:  06/11/2016, MD as PCP - General (FAMILY MEDICINE)  Nancy Nordmann, Roxy Manns  Immunizations - last 24 hours     None        Yanisa Goodgame, 03-08-1996  11/17/2019, 16:38

## 2019-11-17 NOTE — Progress Notes (Signed)
[facility]  FAMILY MEDICINE, [facility]  [address]  [place] [address]  [phone]    Prenatal Encounter 36-40 [facility]      Name: Shari Lawson MRN:  [id number]   Date: 11/17/2019 DOB: [birth year]             EDD: Estimated Date of Delivery: 11/29/19         SUBJECTIVE:  Shari Lawson is a 24 yo G2P1001 at [redacted]w[redacted]d presenting for prenatal care.  Patient has attended two previous prenatal appointments. She has not had labs collected during this pregnancy. She did receive tetanus pertusis and diptheria vaccine at last appointment.  She reports no complications at this time. She is eager to have deliver fetus. She continues to take Zoloft.      Contractions:no   Vaginal bleeding or discharge: no  Leakage of fluid: no  Fetal movement: yes      Current Pregnancy History:  Labs not collected.    Medications:  Current Outpatient Medications   Medication Sig    sertraline (ZOLOFT) 25 mg Oral Tablet Take 1 Tab (25 mg total) by mouth Once a day for 30 days        OBJECTIVE:  BP 110/70 (Site: Left, Patient Position: Sitting, Cuff Size: Adult)    Pulse (!) 106    Temp 36.2 C (97.2 F) (Oral)    Resp 16    Ht 1.549 m (5\' 1" )    Wt 59.4 kg (131 lb)    LMP 02/22/2019 (Approximate)    SpO2 99%    BMI 24.75 kg/m        General:  patient is pleasant and in no acute distress; appears stated age  HEENT:  Head:  normocephalic; atraumatic;   Eyes: PERR; EOM intact   Throat:  moist mucous membranes;   Neck: neck supple; trachea midline  Cardiovascular:  RRR, normal S1/S2 with no gallop or rubs; no murmur  Respiratory:  CTA bilaterally; no crackles or wheezes   Abdomen: soft; gravid   Extremities: no LE edema bilaterally; +2/4 pedal pulses bilaterally; +2/4 posterior tibial pulses bilaterally   Skin: moist; normal turgor  Neurologic: alert and oriented X3; grossly normal   Psychiatric:  very pleasant and talkative; appears in good hygiene and appropriate mood    OB exam:  Fundal  height: size appropriate for dates  FHTs: 130  Edema: none    Cervical exam:  Cervix:     Dilation: 3cm    Effacement: 75%    Station:  -3    Consistency: Medium    Position: Posterior         Prenatal Labs: discussed importance of being collected.    GBS: collected    Urine Dip Results:   Time collected: 1639  Glucose: Negative  Bilirubin: Negative  Ketones: Negative  Urine Specific Gravity: 1.020  Blood (urine): Negative  pH: 7.5  Protein: (!) 1+ (30mg /dL)  Urobilinogen: 1.0 mg/dL  Nitrite: Negative  Leukocytes: (!) Large      ASSESSMENT and PLAN:  24 y.o. G2P1001 with Estimated Date of Delivery: 11/29/19 here for routine prenatal care at [redacted]w[redacted]d WGA.    Pregnancy at 38 Gestation  -Size appropriate for dates  -FHT heard on exam  -Prenatal labs reviewed.   -GBS: collected  -Counseling:    -Discussed labor precautions   -Awareness of fetal movement (kick counts, decreased fetal movement)   -When to come to L&D   -Pain control in labor   -  Work   -Community education officer Care  -Encouraged collection of labs.   -GBS collected today    -RTC 1 weeks    Evaluated patient and discussed treatment plan with attending physician, Dr. Valentino Saxon.    Alexis Goodell, MD 11/17/2019, 17:09  PGY-2  [facility]        Late entry for 11/17/19. I saw and examined the patient.  I reviewed the resident's note.  I agree with the findings and plan of care as documented in the resident's note.  Any exceptions/additions are edited/noted.    Alain Marion, MD

## 2019-11-17 NOTE — Nursing Note (Signed)
11/17/19 1600   Urine test  (Siemens Multistix 10 SG)   Time collected 1639   Color Yellow   Clarity Clear   Glucose Negative   Bilirubin Negative   Ketones Negative   Urine Specific Gravity 1.020   Blood (urine) Negative   pH 7.5   Protein (!) 1+ (30mg /dL)   Urobilinogen 1.0 mg/dL   Nitrite Negative   Leukocytes (!) 3+   Performed Status Automated   Bottle Number   (Siemens Multistix 10 SG) [id number]   Lot # [id number]   Expiration Date 10/10/20   Initials jls

## 2019-11-18 LAB — GROUP B STREPTOCOCCUS DNA BY PCR: GROUP B STREPTOCOCCUS (GBS) DNA BY PCR: NEGATIVE

## 2019-11-19 LAB — URINE CULTURE: URINE CULTURE: 60000 — AB

## 2019-11-20 ENCOUNTER — Encounter (INDEPENDENT_AMBULATORY_CARE_PROVIDER_SITE_OTHER): Payer: Self-pay | Admitting: GENERAL

## 2019-11-23 ENCOUNTER — Ambulatory Visit
Admission: RE | Admit: 2019-11-23 | Discharge: 2019-11-24 | Disposition: A | Discharge status: Home / Self Care | Payer: Medicaid Other | Attending: Family Medicine | Admitting: Family Medicine

## 2019-11-23 ENCOUNTER — Other Ambulatory Visit: Payer: Self-pay

## 2019-11-23 DIAGNOSIS — Z029 Encounter for administrative examinations, unspecified: Secondary | ICD-10-CM

## 2019-11-23 DIAGNOSIS — O26893 Other specified pregnancy related conditions, third trimester: Secondary | ICD-10-CM | POA: Insufficient documentation

## 2019-11-23 DIAGNOSIS — Z3A39 39 weeks gestation of pregnancy: Secondary | ICD-10-CM | POA: Insufficient documentation

## 2019-11-23 DIAGNOSIS — Z87891 Personal history of nicotine dependence: Secondary | ICD-10-CM | POA: Insufficient documentation

## 2019-11-23 LAB — CBC
HCT: 33 % — ABNORMAL LOW (ref 34.8–46.0)
HGB: 10.6 g/dL — ABNORMAL LOW (ref 11.5–16.0)
MCH: 27.6 pg (ref 26.0–32.0)
MCHC: 32.1 g/dL (ref 31.0–35.5)
MCV: 85.9 fL (ref 78.0–100.0)
MPV: 10.4 fL (ref 8.7–12.5)
PLATELETS: 277 10*3/uL (ref 150–400)
RBC: 3.84 10*6/uL — ABNORMAL LOW (ref 3.85–5.22)
RDW-CV: 14 % (ref 11.5–15.5)
WBC: 9.3 10*3/uL (ref 3.7–11.0)

## 2019-11-23 LAB — ABO/RH AND ANTIBODY SCREEN
ABO/RH(D): A NEG
ANTIBODY SCREEN: NEGATIVE

## 2019-11-23 LAB — HIV1/HIV2 SCREEN, COMBINED ANTIGEN AND ANTIBODY: HIV SCREEN, COMBINED ANTIGEN & ANTIBODY: NEGATIVE

## 2019-11-23 MED ORDER — SODIUM CHLORIDE 0.9 % IV BOLUS
1000.0000 mL | INJECTION | Status: AC
Start: 2019-11-23 — End: 2019-11-23
  Administered 2019-11-23: 1000 mL via INTRAVENOUS

## 2019-11-23 MED ORDER — SODIUM CHLORIDE 0.9 % IV BOLUS
1000.0000 mL | INJECTION | Freq: Once | Status: AC
Start: 2019-11-23 — End: 2019-11-23
  Administered 2019-11-23: 1000 mL via INTRAVENOUS

## 2019-11-23 NOTE — H&P (Signed)
[facility]  Obstetrical History & Physical  Labor Triage    Subjective:   Shari Lawson, Shari Lawson is a 24 y.o. G2P1001 female.  Encounter Start Date:  11/23/2019  6:44 PM      Chief Complaint:  Pelvic Pain    HPI:    Patient's last menstrual period was 02/22/2019 (approximate).     Estimated Date of Delivery: 8/18/[redacted]       Weeks gestation:  [redacted]w[redacted]d  Labor:  Not in labor.     Patient came to labor and delivery floor for constant pelvic discomfort that has occurred for the past week. She reports having pelvic pressure with cramping and possible contractions but denies leakage of fluid. She has had vaginal discharge but not more than her normal, also she had one episode of a small amount of bleeding while wiping yesterday. She has not had bleeding since. Denies headache, vision changes, shortness of breath, RUQ abdominal pain and leg pain/swelling.     Her current obstetrical history is significant for insufficient prenatal labs.     OB History   Gravida Para Term Preterm AB Living   2 1 1  0 0 1   SAB TAB Ectopic Multiple Live Births                  # Outcome Date GA Lbr Len/2nd Weight Sex Delivery Anes PTL Lv   2 Current            1 Term              Past Medical History:   Diagnosis Date    Injury of index finger, right, sequela    Depression  Anxiety     No surgical history.       Prenatal Labs: Insufficient Prenatal labs.    No results found for: ABORHD, WBCJ, WBC, HGB, HCT, PLTCNT, HEPBSURFAG, RUB, RUBELLAIGG, SERSYPHTST, RPR, HIV12, STREPSCNOTH   Medications Prior to Admission           Patient is not taking any medications                NS bolus infusion 1,000 mL, 1,000 mL, Intravenous, Now      No Known Allergies  Social History     Tobacco Use    Smoking status: Former Smoker     Packs/day: 0.50     Years: 3.00     Pack years: 1.50     Quit date: 06/27/2019     Years since quitting: 0.4    Smokeless tobacco: Former 06/29/2019     Quit date: 04/13/2016   Substance Use Topics    Alcohol use: Not Currently     Drug use: No     Social History     Substance and Sexual Activity   Drug Use No       Other than ROS in the HPI, all other systems were negative.    Objective:              Temperature: 36.7 C (98 F)  Heart Rate: 69  BP (Non-Invasive): 120/67  Respiratory Rate: 16    General: appears in good health and appears stated age  Eyes: Conjunctiva clear., Pupils equal and round.   Neck: no thyromegaly or lymphadenopathy  Lungs: Clear to auscultation bilaterally.   Cardiovascular: regular rate and rhythm, S1, S2 normal, no murmur, click, rub or gallop  Abdomen: Soft, non-tender, gravid  Extremities: No cyanosis or edema  Skin: Skin warm and  dry  Neurologic: Alert and oriented x3  Psychiatric: Affect Normal, Behavior normal, Speech normal    Cervical Exam:      Dilation: 1 cm   Effacement: 20%   Station:             -3    Consistency: Firm    Position: posterior     Bishop Score: 1          Bishop Scoring System   0 1 2 3    Dilatation 0 1-2 3-4 5 or more   Effacement 0-30 40-50 60-70 80 -100   Station -3 -2 -1, 0 +1, +2   Position Posterior Mid Anterior    Consistency Firm Medium Soft        Presentation: Vertex    Fetal heart rate tracing:    Baseline: 150  Variability: Moderate  Accelerations: Present  Decelerations: None  NICHD Category: I (Normal) Reactive    Contraction Pattern Assessment: irregular  Tachysystole: Absent    Labs:  Lab Results for Last 24 Hours:  No results found for any visits on 11/23/19 (from the past 24 hour(s)).     Assessment:    Shari Lawson, Shari Lawson is a 24 y/o G2P1001 who presents to labor and delivery for pelvic pressure/pain for the past week.     Plan:    Patient currently not in labor, but pelvic pressure has started to increase since arriving in labor and delivery.   - Prenatal labs ordered, collect UA  -NST reactive   -NS bolus 30 x2  -No additional episodes of bleeding   - Continue to monitor patient, vitals are stable, will recheck cervix in 2 hours, if no further changes have been  made plan for discharge home. Pt. Has OB appointment with tomorrow 11/24/19.     Plan discussed with Dr. 11/26/19 and Dr. Nanda Quinton.     Elesa Massed, MD PGY1      We discussed the management of her false labor at the time of the visit.  I agree with the assessment and plan.    Ivonne Andrew, MD 11/24/2019, 16:05

## 2019-11-24 ENCOUNTER — Other Ambulatory Visit: Payer: Self-pay

## 2019-11-24 ENCOUNTER — Ambulatory Visit (INDEPENDENT_AMBULATORY_CARE_PROVIDER_SITE_OTHER): Payer: Medicaid Other | Admitting: GENERAL

## 2019-11-24 ENCOUNTER — Encounter (INDEPENDENT_AMBULATORY_CARE_PROVIDER_SITE_OTHER): Payer: Self-pay | Admitting: GENERAL

## 2019-11-24 VITALS — BP 112/70 | HR 90 | Temp 100.2°F | Resp 16 | Ht 61.0 in | Wt 133.0 lb

## 2019-11-24 DIAGNOSIS — Z6791 Unspecified blood type, Rh negative: Secondary | ICD-10-CM

## 2019-11-24 DIAGNOSIS — Z3A39 39 weeks gestation of pregnancy: Secondary | ICD-10-CM

## 2019-11-24 DIAGNOSIS — O26893 Other specified pregnancy related conditions, third trimester: Secondary | ICD-10-CM

## 2019-11-24 LAB — URINALYSIS, MACROSCOPIC
BILIRUBIN: NEGATIVE mg/dL
GLUCOSE: NORMAL mg/dL
KETONES: NEGATIVE mg/dL
NITRITE: NEGATIVE
PH: 7 (ref 5.0–7.5)
PROTEIN: NEGATIVE mg/dL
SPECIFIC GRAVITY: 1.005 (ref 1.005–1.020)
UROBILINOGEN: <1 mg/dL (ref <=2.0)

## 2019-11-24 LAB — SYPHILIS SCREENING ALGORITHM WITH REFLEX, SERUM: SYPHILIS TP ANTIBODIES: NONREACTIVE

## 2019-11-24 LAB — URINALYSIS, MICROSCOPIC

## 2019-11-24 LAB — HEPATITIS C ANTIBODY SCREEN WITH REFLEX TO HCV PCR: HCV ANTIBODY QUALITATIVE: NEGATIVE

## 2019-11-24 LAB — CHLAMYDIA TRACHOMATIS/NEISSERIA GONORRHOEAE RNA, NAAT
CHLAMYDIA TRACHOMATIS RNA: NEGATIVE
NEISSERIA GONORRHEA GC RNA: NEGATIVE

## 2019-11-24 LAB — HEPATITIS B SURFACE ANTIGEN: HBV SURFACE ANTIGEN QUALITATIVE: NEGATIVE

## 2019-11-24 NOTE — Patient Instructions (Signed)
Please call [phone] and ask to talk to L&D to schedule rhogam infusion.

## 2019-11-24 NOTE — Progress Notes (Signed)
[facility]  FAMILY MEDICINE, [facility]  [address]  [place] [address]  [phone]    Prenatal Encounter 36-40 [facility]      Name: Shari Lawson MRN:  [id number]   Date: 11/24/2019 DOB: Jan 09, 1996             EDD: Estimated Date of Delivery: 11/29/19         SUBJECTIVE:  Shari Lawson is a 24 yo G2P1001 who presents to clinic for routine prenatal care.  Patient was seen at L&D from concern of contractions and spotting.  Contractions were irregular.  Patient without cervical change.  She states she does not want to schedule induction one day prior to EDD.  She states she is going to go the [facility].  Patient did have labs collected.  Patient will need rhogam. She has received TDAP this pregnancy.  She has not completed GTT or GCT.     Contractions:yes, irregular   Vaginal bleeding or discharge: no  Leakage of fluid: no  Fetal movement: yes      Current Pregnancy History:  Blood Type: A-  CBC: 10.6  RPR: non-reactive  HIV: neg  HbsAg: neg  HCV: neg  Rubella: pending  Gonorrhea: neg  Chlamydia: neg    Medications:  Current Outpatient Medications   Medication Sig    sertraline (ZOLOFT) 25 mg Oral Tablet Take 1 Tab (25 mg total) by mouth Once a day for 30 days        OBJECTIVE:  BP 112/70 (Site: Right, Patient Position: Sitting, Cuff Size: Adult)    Pulse 90    Temp 37.9 C (100.2 F) (Oral)    Resp 16    Ht 1.549 m (5\' 1" )    Wt 60.3 kg (133 lb)    LMP 02/22/2019 (Approximate)    BMI 25.13 kg/m     General:  patient is pleasant and in no acute distress; appears stated age  Abdomen: soft; gravid   Extremities: no LE edema bilaterally; +2/4 pedal pulses bilaterally; +2/4 posterior tibial pulses bilaterally   Skin: moist; normal turgor  Neurologic: alert and oriented X3; grossly normal   Psychiatric:  Patient visuably upset    OB exam:  Fundal height: 38  FHTs: 135  Edema: none      Prenatal Labs: Reviewed    GBS: Negative    Urine Dip Results: Small  leukocytes    ASSESSMENT and PLAN:  24 y.o. G2P1001 with Estimated Date of Delivery: 11/29/19 here for routine prenatal care at [redacted]w[redacted]d WGA.    Pregnancy at [redacted] weeks Gestation  -Size appropriate for dates  -FHT heard on exam  -Prenatal labs reviewed. RH negative. Rubella pending  -GBS: Negative  -Counseling:    -Discussed labor precautions   -Awareness of fetal movement (kick counts, decreased fetal movement)   -When to come to L&D     RH negative   -Rhogam ordered    -Gave patient hospital number to schedule infusion.    -Will need consent signed when patient arrives to the floor    -RTC 1 weeks    Evaluated patient and discussed treatment plan with attending physician, Dr. [redacted]w[redacted]d.    Zadie Rhine, MD 11/24/2019, 16:06  PGY-2  [facility]      I saw and examined the patient.  I reviewed the resident's note.  I agree with the findings and plan of care as documented in the resident's note.  Any exceptions/additions  are edited/noted.    Hiram Comber, MD

## 2019-11-24 NOTE — Nurses Notes (Signed)
PATIENT TO THE BATHROOM

## 2019-11-24 NOTE — Nursing Note (Signed)
Chief Complaint:     Functional Health Screen  Functional Health Screening:        LMP 02/22/2019 (Approximate)       Social History     Tobacco Use   Smoking Status Former Smoker   . Packs/day: 0.50   . Years: 3.00   . Pack years: 1.50   . Quit date: 06/27/2019   . Years since quitting: 0.4   Smokeless Tobacco Former Neurosurgeon   . Quit date: 04/13/2016     Patient Health Rating           Depression Screening  PHQ Questionnaire     Allergies:  No Known Allergies  Medication History  Reviewed for OTC medication and any new medications, provider will review medication history  Results through Enter/Edit  Results for orders placed or performed during the hospital encounter of 11/23/19 (from the past 24 hour(s))   CBC    Collection Time: 11/23/19  8:08 PM   Result Value Ref Range    WBC 9.3 3.7 - 11.0 x10^3/uL    RBC 3.84 (L) 3.85 - 5.22 x10^6/uL    HGB 10.6 (L) 11.5 - 16.0 g/dL    HCT 16.1 (L) 09.6 - 46.0 %    MCV 85.9 78.0 - 100.0 fL    MCH 27.6 26.0 - 32.0 pg    MCHC 32.1 31.0 - 35.5 g/dL    RDW-CV 04.5 40.9 - 81.1 %    PLATELETS 277 150 - 400 x10^3/uL    MPV 10.4 8.7 - 12.5 fL   HEPATITIS C ANTIBODY SCREEN WITH REFLEX TO HCV PCR    Collection Time: 11/23/19  8:08 PM   Result Value Ref Range    HCV ANTIBODY QUALITATIVE Negative Negative   HEPATITIS B SURFACE ANTIGEN    Collection Time: 11/23/19  8:09 PM   Result Value Ref Range    HBV SURFACE ANTIGEN QUALITATIVE Negative Negative   HIV1/HIV2 SCREEN, COMBINED ANTIGEN AND ANTIBODY    Collection Time: 11/23/19  8:09 PM   Result Value Ref Range    HIV SCREEN, COMBINED ANTIGEN & ANTIBODY Negative Negative   ABO/RH AND ANTIBODY SCREEN    Collection Time: 11/23/19  8:09 PM   Result Value Ref Range    ABO/RH(D) A NEGATIVE     ANTIBODY SCREEN NEGATIVE    SYPHILIS TP ANTIBODIES    Collection Time: 11/23/19  8:11 PM   Result Value Ref Range    SYPHILIS TP ANTIBODIES QUALITATIVE Non-reactive Non-reactive   CHLAMYDIA / N. GONORRHEA RNA    Collection Time: 11/23/19  8:58 PM   Result Value  Ref Range    CHLAMYDIA TRACHOMATIS RNA Negative Negative    NEISSERIA GONORRHEA GC RNA Negative Negative   URINALYSIS, MACROSCOPIC    Collection Time: 11/24/19 12:00 AM   Result Value Ref Range    COLOR Yellow Yellow    APPEARANCE Cloudy (A) Clear    PH 7.0 5.0 - 7.5    LEUKOCYTES 3+ (A) Negative WBCs/uL    NITRITE Negative Negative    PROTEIN Negative Negative mg/dL    GLUCOSE Normal Normal mg/dL    KETONES Negative Negative mg/dL    UROBILINOGEN < 1 <=9.1 mg/dL    BILIRUBIN Negative Negative mg/dL    BLOOD 1+ (A) Negative mg/dL    SPECIFIC GRAVITY 4.782 1.005 - 1.020   URINALYSIS, MICROSCOPIC    Collection Time: 11/24/19 12:00 AM   Result Value Ref Range    RBCS 0-2 0-2, None /hpf  WBCS 6-10 (A) 0-2, 3-5, None /hpf    BACTERIA 2+ (A) None /hpf    SQUAMOUS EPITHELIAL 21-30 (A) 0-2, None /hpf     POCT Results  Care Team  Patient Care Team:  Nancy Nordmann, MD as PCP - General (FAMILY MEDICINE)  Roxy Manns, [place]  Immunizations - last 24 hours     None        Nissa Stannard, [place]  11/24/2019, 14:51

## 2019-11-25 LAB — URINE CULTURE,ROUTINE: URINE CULTURE: 30000 — AB

## 2019-11-27 ENCOUNTER — Inpatient Hospital Stay
Admission: RE | Admit: 2019-11-27 | Discharge: 2019-11-29 | DRG: 807 | Disposition: A | Discharge status: Home / Self Care | Payer: Medicaid Other | Attending: Obstetrics | Admitting: Obstetrics

## 2019-11-27 ENCOUNTER — Telehealth (INDEPENDENT_AMBULATORY_CARE_PROVIDER_SITE_OTHER): Payer: Self-pay | Admitting: GENERAL

## 2019-11-27 ENCOUNTER — Other Ambulatory Visit: Payer: Self-pay

## 2019-11-27 ENCOUNTER — Encounter (INDEPENDENT_AMBULATORY_CARE_PROVIDER_SITE_OTHER): Payer: Self-pay | Admitting: GENERAL

## 2019-11-27 ENCOUNTER — Encounter (HOSPITAL_COMMUNITY): Payer: Self-pay | Admitting: Obstetrics

## 2019-11-27 DIAGNOSIS — O26893 Other specified pregnancy related conditions, third trimester: Principal | ICD-10-CM | POA: Diagnosis present

## 2019-11-27 DIAGNOSIS — F329 Major depressive disorder, single episode, unspecified: Secondary | ICD-10-CM | POA: Diagnosis present

## 2019-11-27 DIAGNOSIS — Z79899 Other long term (current) drug therapy: Secondary | ICD-10-CM

## 2019-11-27 DIAGNOSIS — O99344 Other mental disorders complicating childbirth: Secondary | ICD-10-CM | POA: Diagnosis present

## 2019-11-27 DIAGNOSIS — O26899 Other specified pregnancy related conditions, unspecified trimester: Secondary | ICD-10-CM | POA: Diagnosis present

## 2019-11-27 DIAGNOSIS — O0930 Supervision of pregnancy with insufficient antenatal care, unspecified trimester: Secondary | ICD-10-CM | POA: Diagnosis present

## 2019-11-27 DIAGNOSIS — Z6791 Unspecified blood type, Rh negative: Secondary | ICD-10-CM

## 2019-11-27 DIAGNOSIS — Z3A39 39 weeks gestation of pregnancy: Secondary | ICD-10-CM

## 2019-11-27 DIAGNOSIS — Z23 Encounter for immunization: Secondary | ICD-10-CM

## 2019-11-27 DIAGNOSIS — Z87891 Personal history of nicotine dependence: Secondary | ICD-10-CM

## 2019-11-27 LAB — CBC WITH DIFF
BASOPHIL #: <0.1 10*3/uL (ref <=0.20)
BASOPHIL %: 0 %
EOSINOPHIL #: <0.1 10*3/uL (ref <=0.50)
EOSINOPHIL %: 0 %
HCT: 32.1 % — ABNORMAL LOW (ref 34.8–46.0)
HGB: 10.5 g/dL — ABNORMAL LOW (ref 11.5–16.0)
IMMATURE GRANULOCYTE #: <0.1 10*3/uL (ref <0.10)
IMMATURE GRANULOCYTE %: 0 % (ref 0–1)
LYMPHOCYTE #: 1.25 10*3/uL (ref 1.00–4.80)
LYMPHOCYTE %: 11 %
MCH: 27.9 pg (ref 26.0–32.0)
MCHC: 32.7 g/dL (ref 31.0–35.5)
MCV: 85.1 fL (ref 78.0–100.0)
MONOCYTE #: 0.52 10*3/uL (ref 0.20–1.10)
MONOCYTE %: 5 %
MPV: 10.7 fL (ref 8.7–12.5)
NEUTROPHIL #: 9.28 10*3/uL — ABNORMAL HIGH (ref 1.50–7.70)
NEUTROPHIL %: 84 %
PLATELETS: 275 10*3/uL (ref 150–400)
RBC: 3.77 10*6/uL — ABNORMAL LOW (ref 3.85–5.22)
RDW-CV: 13.8 % (ref 11.5–15.5)
WBC: 11.1 10*3/uL — ABNORMAL HIGH (ref 3.7–11.0)

## 2019-11-27 LAB — DRUG SCREEN, NO CONFIRMATION, URINE
AMPHETAMINES, URINE: NEGATIVE
BARBITURATES URINE: NEGATIVE
BENZODIAZEPINES URINE: NEGATIVE
BUPRENORPHINE URINE: NEGATIVE
CANNABINOIDS URINE: POSITIVE — AB
COCAINE METABOLITES URINE: NEGATIVE
CREATININE RANDOM URINE: 23 mg/dL — ABNORMAL LOW (ref 50–100)
ECSTASY/MDMA URINE: NEGATIVE
FENTANYL, RANDOM URINE: NEGATIVE
METHADONE URINE: NEGATIVE
OPIATES URINE (LOW CUTOFF): NEGATIVE
OXYCODONE URINE: NEGATIVE

## 2019-11-27 LAB — COMPREHENSIVE METABOLIC PANEL, NON-FASTING
ALBUMIN: 2.7 g/dL — ABNORMAL LOW (ref 3.5–5.0)
ALKALINE PHOSPHATASE: 181 U/L — ABNORMAL HIGH (ref 40–110)
ALT (SGPT): 7 U/L — ABNORMAL LOW (ref 8–22)
ANION GAP: 8 mmol/L (ref 4–13)
AST (SGOT): 13 U/L (ref 8–45)
BILIRUBIN TOTAL: 0.5 mg/dL (ref 0.3–1.3)
BUN/CREA RATIO: 5 — ABNORMAL LOW (ref 6–22)
BUN: 3 mg/dL — ABNORMAL LOW (ref 8–25)
CALCIUM: 9 mg/dL (ref 8.5–10.0)
CHLORIDE: 113 mmol/L — ABNORMAL HIGH (ref 96–111)
CO2 TOTAL: 20 mmol/L — ABNORMAL LOW (ref 22–30)
CREATININE: 0.64 mg/dL (ref 0.60–1.05)
ESTIMATED GFR: >90 mL/min/BSA (ref >=60)
GLUCOSE: 79 mg/dL (ref 65–125)
POTASSIUM: 4.1 mmol/L (ref 3.5–5.1)
PROTEIN TOTAL: 5.6 g/dL — ABNORMAL LOW (ref 6.4–8.3)
SODIUM: 141 mmol/L (ref 136–145)

## 2019-11-27 LAB — TYPE AND SCREEN
ABO/RH(D): A NEG
ANTIBODY SCREEN: NEGATIVE

## 2019-11-27 LAB — RUBELLA VIRUS ANTIBODIES, IGG, SERUM: RUBELLA IGG QUALITATIVE: UNDETERMINED — AB

## 2019-11-27 MED ORDER — SODIUM CHLORIDE 0.9 % (FLUSH) INJECTION SYRINGE
10.0000 mL | INJECTION | INTRAMUSCULAR | Status: DC | PRN
Start: 2019-11-27 — End: 2019-11-29

## 2019-11-27 MED ORDER — CARBOPROST TROMETHAMINE 250 MCG/ML INTRAMUSCULAR SOLUTION
250.0000 ug | Freq: Once | INTRAMUSCULAR | Status: DC | PRN
Start: 2019-11-27 — End: 2019-11-29

## 2019-11-27 MED ORDER — METHYLERGONOVINE 0.2 MG/ML (1 ML) INJECTION SOLUTION
0.2000 mg | Freq: Once | INTRAMUSCULAR | Status: DC | PRN
Start: 2019-11-27 — End: 2019-11-28

## 2019-11-27 MED ORDER — MISOPROSTOL 100 MCG TABLET
25.0000 ug | ORAL_TABLET | ORAL | Status: DC | PRN
Start: 2019-11-27 — End: 2019-11-28

## 2019-11-27 MED ORDER — MISOPROSTOL 100 MCG TABLET
25.0000 ug | ORAL_TABLET | ORAL | Status: DC | PRN
Start: 2019-11-27 — End: 2019-11-27

## 2019-11-27 MED ORDER — MEASLES,MUMPS,RUBELLA VACCINE LIVE(PF)1,000-12,500TCID50/0.5 ML SUBCUT
0.5000 mL | Freq: Once | SUBCUTANEOUS | Status: AC
Start: 2019-11-29 — End: 2019-11-29
  Administered 2019-11-29: 0.5 mL via SUBCUTANEOUS
  Filled 2019-11-27: qty 0.5

## 2019-11-27 MED ORDER — BUTORPHANOL 2 MG/ML INJECTION SOLUTION
2.0000 mg | INTRAMUSCULAR | Status: DC | PRN
Start: 2019-11-27 — End: 2019-11-28
  Administered 2019-11-27: 2 mg via INTRAVENOUS
  Filled 2019-11-27 (×2): qty 1

## 2019-11-27 MED ORDER — TRANEXAMIC ACID 1,000 MG/10 ML (100 MG/ML) INTRAVENOUS SOLUTION
1000.0000 mg | Freq: Once | INTRAVENOUS | Status: DC | PRN
Start: 2019-11-27 — End: 2019-11-28

## 2019-11-27 MED ORDER — MISOPROSTOL 100 MCG TABLET
25.0000 ug | ORAL_TABLET | Freq: Once | ORAL | Status: AC
Start: 2019-11-27 — End: 2019-11-27
  Administered 2019-11-27: 25 ug via VAGINAL
  Filled 2019-11-27: qty 1

## 2019-11-27 MED ORDER — MISOPROSTOL 100 MCG TABLET
1000.0000 ug | ORAL_TABLET | Freq: Once | ORAL | Status: AC | PRN
Start: 2019-11-27 — End: 2019-11-28
  Administered 2019-11-28: 600 ug via RECTAL
  Filled 2019-11-27: qty 10

## 2019-11-27 MED ORDER — OXYTOCIN 30 UNIT/500 ML IN 0.9 % SODIUM CHLORIDE INTRAVENOUS
333.0000 mL/h | Freq: Once | INTRAVENOUS | Status: DC | PRN
Start: 2019-11-27 — End: 2019-11-28
  Filled 2019-11-27: qty 500

## 2019-11-27 MED ORDER — LIDOCAINE (PF) 10 MG/ML (1 %) INJECTION SOLUTION
30.0000 mL | Freq: Once | INTRAMUSCULAR | Status: DC | PRN
Start: 2019-11-27 — End: 2019-11-28

## 2019-11-27 MED ORDER — LACTATED RINGERS INTRAVENOUS SOLUTION
INTRAVENOUS | Status: DC
Start: 2019-11-27 — End: 2019-11-28
  Administered 2019-11-28: 150 mL/h via INTRAVENOUS

## 2019-11-27 NOTE — Nurses Notes (Signed)
Monitor off. Up to walk.

## 2019-11-27 NOTE — Nurses Notes (Signed)
Patient to unit from ED for scheduled induction.

## 2019-11-27 NOTE — Nurses Notes (Signed)
Monitor reapplied.

## 2019-11-27 NOTE — Nurses Notes (Signed)
Monitor off. walking in room. Denied need for pain med.

## 2019-11-27 NOTE — Care Plan (Signed)
Problem: Bleeding (Labor)  Goal: Hemostasis  Outcome: Ongoing (see interventions/notes)     Problem: Change in Fetal Wellbeing (Labor)  Goal: Stable Fetal Wellbeing  Outcome: Ongoing (see interventions/notes)     Problem: Delayed Labor Progression (Labor)  Goal: Effective Progression to Delivery  Outcome: Ongoing (see interventions/notes)     Problem: Infection (Labor)  Goal: Absence of Infection Signs and Symptoms  Outcome: Ongoing (see interventions/notes)     Problem: Labor Pain (Labor)  Goal: Acceptable Pain Control  Outcome: Ongoing (see interventions/notes)     Problem: Uterine Tachysystole (Labor)  Goal: Normal Uterine Contraction Pattern  Outcome: Ongoing (see interventions/notes)

## 2019-11-27 NOTE — H&P (Signed)
History & Physical - Antepartum  Name of Patient: Shari Lawson  DOB: [birth year]  MRN: [id number]    Date of Service: 11/27/2019     EDD: Estimated Date of Delivery: 11/29/19    CC:    Elective induction of labor     HPI:      Shari Lawson is Shari 24 y.o. G2P1001 at 43w5dhere for elective induction of labor. Prenatal history noted for insufficient prenatal care and rh negative. Patient is GBS negative. Dating by LMP verified by 2nd trimester ultrasound. Patient did not complete GTT or GCT.  No issues with HTN or asthma. Patient is currently asymptomatic without contractions.    OB ROS:   Pertinent negatives include: vaginal discharge, vaginal bleeding, loss of fluid, contractions and dysuria  Pertinent positives include: fetal movement    Gen: denies fevers, chills  Eyes: denies change in vision  Resp: denies SOB  CV: denies chest pain, lower extremity swelling  GI: denies N/V/D, RUQ pain  GU: denies vaginal discharge, bleeding, dysuria  Neuro: denies headache      History:  OB History     Gravida   2    Para   1    Term   1    Preterm   0    AB   0    Living   1       SAB        TAB        Ectopic        Multiple        Live Births                     Past Medical History:   Diagnosis Date   . Injury of index finger, right, sequela      Social History     Socioeconomic History   . Marital status: Single     Spouse name: Not on file   . Number of children: Not on file   . Years of education: Not on file   . Highest education level: Not on file   Occupational History   . Not on file   Tobacco Use   . Smoking status: Former Smoker     Packs/day: 0.50     Years: 3.00     Pack years: 1.50     Quit date: 06/27/2019     Years since quitting: 0.4   . Smokeless tobacco: Former USystems developer    Quit date: 04/13/2016   Substance and Sexual Activity   . Alcohol use: Not Currently   . Drug use: No   . Sexual activity: Not Currently   Other Topics Concern   . Not on file   Social History Narrative   . Not on file     Social  Determinants of Health     Financial Resource Strain:    . Difficulty of Paying Living Expenses:    Food Insecurity:    . Worried About RCharity fundraiserin the Last Year:    . RArboriculturistin the Last Year:    Transportation Needs:    . LFilm/video editor(Medical):    .Marland KitchenLack of Transportation (Non-Medical):    Physical Activity:    . Days of Exercise per Week:    . Minutes of Exercise per Session:    Stress:    . Feeling of Stress :    Intimate Partner Violence:    .  Fear of Current or Ex-Partner:    . Emotionally Abused:    Marland Kitchen Physically Abused:    . Sexually Abused:        (Not in an outpatient encounter)        Allergies:  No Known Allergies    Physical Exam:  Filed Vitals:    11/27/19 1719   BP: (!) 127/56   Pulse: 86       Gen: well-appearing, well-nourished, White, female NAD  Eyes: pupils equal and round, anicteric sclera, conjunctiva not injected  HENT: ENMT moist, oropharynx without erythema or exudate, normal dentition  Neck: supple, symmetrical, thyroid smooth  CV: normal rate, regular rhythm, no murmurs auscultated, no LE edema  Resp: clear to auscultation bilaterally, unlabored breathing on room air  Abd: soft, gravid, non-tender, no palpable contractions, fundal height appropriate for gestational age  MSK: normal bulk and tone, no deformities  Skin: warm and dry, no lesions  Neuro: alert and oriented x 4, no tremor  Lymph: no cervical, supraclavicular, or submandibular lymphadenopathy  Psych: calm, cooperative                            FHT: 140 baseline, moderate variability, accelerations present, decelerations absent, overall reassuring: yes, Category: I      TOCO: contractions are irregular      Presentations: vertex     Bishop scoring     Cervical Consistency: 1 point: medium  Dilation: 1 point: 1 - 2 cm  Effacement: 1 point: 40 - 50 %  Cervical Position: 0 - Posterior  Station: 0:  -3 station    Bishop Scoring System   0 '1 2 3   '$ Dilatation 0 1-2 3-4 5 or more   Effacement 0-30 40-50  60-70 80 -100   Station -3 -2 -1, 0 +1, +2   Position Posterior Mid Anterior    Consistency Firm Medium Soft        Total: 3    Favorable cervix: (</= 3 = N, >/= 6 = Y) no    Prenatal Labs:  Blood Type: Shari-  Hg/Hct: 10.6/33.0  Rubella: equivocal  GC: Negative  Chlamydia: Negative  GYI:RSWNIOEV   HIV: Negative  RPR: Negative  HBsAb: Negative      Labs:  I have reviewed all lab results.    Assessment:  Shari Lawson is Shari 24 y.o. G2P1001 here at 63w5dfor induction of labor, following insufficient prenatal care and rh negative    Plan:    - Admit to L&D  - EFM/Toco  - VS q1 hr  - IV LR at 1531mhr  - Regular diet, and change to clears following dinner.   - urine drug screen ordered  - CBC, T&S, also added CMP due to not having GTT completed  - consult OB Anesthesia for epidural placement  - Cytotec 25 mcg vaginally, repeat orally again if indicated in 4 hours  - repeat cervical exam Q4h or PRN clinical indication  - Will need rhogam and MMR following delivery.  - expectant mgmt    Shari MaAdaliah Hiegelas discussed with Dr. BoDeon Pilling     /AStormy CardMD 11/27/2019, 17:36  PGY-2  [facility]    I saw and examined the patient with and reviewed the Residents'/Fellows' notes, agree with the findings and plan of care as documented in the Residents/Fellows notes.  I have made and approve of any additions and/or  edits to the note.  Shari Lawson, Lawson

## 2019-11-27 NOTE — Care Plan (Signed)
Problem: Bleeding (Labor)  Goal: Hemostasis  Outcome: Ongoing (see interventions/notes)  Intervention: Monitor for Presence of Bleeding  Flowsheets (Taken 11/27/2019 1953)  Perinatal Bleed Management: Rh status confirmed  Intervention: Manage Bleeding  Flowsheets (Taken 11/27/2019 1953)  Perinatal Bleed Management: Rh status confirmed     Problem: Change in Fetal Wellbeing (Labor)  Goal: Stable Fetal Wellbeing  Outcome: Ongoing (see interventions/notes)  Intervention: Promote and Monitor Fetal Wellbeing  Flowsheets (Taken 11/27/2019 1753 by Octavia Heir, RN)  Body Position: positioned/repositioned independently     Problem: Delayed Labor Progression (Labor)  Goal: Effective Progression to Delivery  Outcome: Ongoing (see interventions/notes)     Problem: Infection (Labor)  Goal: Absence of Infection Signs and Symptoms  Outcome: Ongoing (see interventions/notes)  Intervention: Prevent or Manage Infection  Flowsheets  Taken 11/27/2019 1953 by Vinie Sill, RN  Infection Management: aseptic technique maintained  Taken 11/27/2019 1753 by Octavia Heir, RN  Infection Prevention:   single patient room provided   visitors restricted/screened   rest/sleep promoted   promote handwashing     Problem: Labor Pain (Labor)  Goal: Acceptable Pain Control  Outcome: Ongoing (see interventions/notes)  Intervention: Support Labor Pain Coping and Management  Flowsheets (Taken 11/27/2019 1753 by Octavia Heir, RN)  Sensory Stimulation Regulation:   care clustered   lighting decreased   quiet environment promoted   visitors limited     Problem: Uterine Tachysystole (Labor)  Goal: Normal Uterine Contraction Pattern  Outcome: Ongoing (see interventions/notes)

## 2019-11-27 NOTE — Nurses Notes (Signed)
Requesting pain meds. Cervix  2-3 cm, 70%. Status report to Dr. Fayrene Fearing. orders received.

## 2019-11-28 ENCOUNTER — Inpatient Hospital Stay (HOSPITAL_COMMUNITY): Payer: Medicaid Other | Admitting: Certified Registered"

## 2019-11-28 ENCOUNTER — Encounter (HOSPITAL_COMMUNITY): Payer: Self-pay

## 2019-11-28 ENCOUNTER — Encounter (HOSPITAL_COMMUNITY): Payer: Self-pay | Admitting: Obstetrics

## 2019-11-28 DIAGNOSIS — Z3A39 39 weeks gestation of pregnancy: Secondary | ICD-10-CM

## 2019-11-28 DIAGNOSIS — Z6711 Type A blood, Rh negative: Secondary | ICD-10-CM

## 2019-11-28 DIAGNOSIS — O0933 Supervision of pregnancy with insufficient antenatal care, third trimester: Secondary | ICD-10-CM

## 2019-11-28 LAB — COVID-19 ~~LOC~~ MOLECULAR LAB TESTING: SARS-CoV-2: NOT DETECTED

## 2019-11-28 LAB — ABO/RH AND ANTIBODY SCREEN
ABO/RH(D): A NEG
ANTIBODY SCREEN: NEGATIVE

## 2019-11-28 MED ORDER — ONDANSETRON HCL (PF) 4 MG/2 ML INJECTION SOLUTION
INTRAMUSCULAR | Status: AC
Start: 2019-11-28 — End: 2019-11-28
  Administered 2019-11-28: 4 mg via INTRAVENOUS
  Filled 2019-11-28: qty 2

## 2019-11-28 MED ORDER — OXYTOCIN 30 UNIT/500 ML IN 0.9 % SODIUM CHLORIDE INTRAVENOUS
1.0000 m[IU]/min | INTRAVENOUS | Status: DC
Start: 2019-11-28 — End: 2019-11-28
  Administered 2019-11-28: 2 m[IU]/min via INTRAVENOUS
  Administered 2019-11-28: 8 m[IU]/min via INTRAVENOUS
  Administered 2019-11-28: 4 m[IU]/min via INTRAVENOUS
  Administered 2019-11-28: 6 m[IU]/min via INTRAVENOUS

## 2019-11-28 MED ORDER — BUTORPHANOL 2 MG/ML INJECTION SOLUTION
2.0000 mg | INTRAMUSCULAR | Status: DC | PRN
Start: 2019-11-28 — End: 2019-11-28
  Administered 2019-11-28: 2 mg via INTRAVENOUS

## 2019-11-28 MED ORDER — IBUPROFEN 800 MG TABLET
800.0000 mg | ORAL_TABLET | Freq: Three times a day (TID) | ORAL | Status: DC | PRN
Start: 2019-11-28 — End: 2019-11-29
  Administered 2019-11-29: 800 mg via ORAL
  Filled 2019-11-28 (×2): qty 1

## 2019-11-28 MED ORDER — LANOLIN TOPICAL CREAM
TOPICAL_CREAM | CUTANEOUS | Status: DC | PRN
Start: 2019-11-28 — End: 2019-11-29

## 2019-11-28 MED ORDER — ROPIVACAINE (PF) 2 MG/ML (0.2 %) INJECTION SOLUTION
INTRAMUSCULAR | Status: AC
Start: 2019-11-28 — End: 2019-11-28
  Filled 2019-11-28: qty 20

## 2019-11-28 MED ORDER — ONDANSETRON HCL (PF) 4 MG/2 ML INJECTION SOLUTION
4.0000 mg | Freq: Three times a day (TID) | INTRAMUSCULAR | Status: DC | PRN
Start: 2019-11-28 — End: 2019-11-29
  Administered 2019-11-28: 4 mg via INTRAVENOUS
  Filled 2019-11-28: qty 2

## 2019-11-28 MED ORDER — ROPIVACAINE (PF) 2 MG/ML (0.2 %) INJECTION SOLUTION
10.0000 mL | INTRAMUSCULAR | Status: AC
Start: 2019-11-28 — End: 2019-11-28
  Administered 2019-11-28: 10 mL via EPIDURAL

## 2019-11-28 MED ORDER — DOCUSATE SODIUM 100 MG CAPSULE
100.0000 mg | ORAL_CAPSULE | Freq: Two times a day (BID) | ORAL | Status: DC
Start: 2019-11-28 — End: 2019-11-29
  Administered 2019-11-28 – 2019-11-29 (×2): 100 mg via ORAL
  Filled 2019-11-28 (×3): qty 1

## 2019-11-28 MED ORDER — FENTANYL-BUPIVACAINE-NACL (PF) 2 MCG/ML-0.125 % EPIDURAL - JMH
EPIDURAL | Status: DC
Start: 2019-11-28 — End: 2019-11-28

## 2019-11-28 MED ORDER — RHO(D) IMMUNE GLOBULIN 1,500 UNIT (300 MCG) INTRAMUSCULAR SYRINGE
300.0000 ug | INJECTION | Freq: Once | INTRAMUSCULAR | Status: AC
Start: 2019-11-28 — End: 2019-11-29
  Administered 2019-11-29: 300 ug via INTRAMUSCULAR
  Filled 2019-11-28: qty 300

## 2019-11-28 MED ORDER — FENTANYL 2MCG/ML-BUPIVACAINE 0.125% IN NS EPIDURAL INFUSION
INJECTION | Status: AC
Start: 2019-11-28 — End: 2019-11-28
  Filled 2019-11-28: qty 250

## 2019-11-28 NOTE — Anesthesia Preprocedure Evaluation (Signed)
ANESTHESIA PRE-OP EVALUATION  Planned Procedure: ANES - LABOR ANALGESIA  Review of Systems     anesthesia history negative     patient summary reviewed  nursing notes reviewed        Pulmonary  negative pulmonary ROS,    Cardiovascular  negative cardio ROS,   ECG reviewed ,No peripheral edema,        GI/Hepatic/Renal   negative GI/hepatic/renal ROS,      Endo/Other   neg endo/other ROS,        Neuro/Psych/MS   negative neuro/psych ROS,      Cancer  negative hematology/oncology ROS,                    Physical Assessment      Patient summary reviewed and Nursing notes reviewed   Airway       Mallampati: II    TM distance: >3 FB    Neck ROM: full  Mouth Opening: good.  No Facial hair          Dental       Dentition intact             Pulmonary    Breath sounds clear to auscultation  (-) no rhonchi, no decreased breath sounds, no wheezes, no rales and no stridor     Cardiovascular    Rhythm: regular  Rate: Normal  (-) no friction rub, carotid bruit is not present, no peripheral edema and no murmur     Other findings            Plan  ASA 2     Planned anesthesia type: epidural        SLEEP APNEA  Patient is at risk of obstructive sleep apnea      Intravenous induction     Anesthesia issues/risks discussed are: Nerve Injuries, Failure of Block, High Neuraxial Block, Blood Loss, Local Anesthetic Systemic Toxicity and Spinal Headache.  Anesthetic plan and risks discussed with patient.          Patient's NPO status is appropriate for Anesthesia.

## 2019-11-28 NOTE — Progress Notes (Signed)
PROGRESS NOTE - Laboring    Subjective:   Shari Lawson is a 24 y.o.y G2P1001 at [redacted]w[redacted]d. She has nausea. Pt is not requesting pain meds. Epidural in place. Pt is not having increased pelvic pressure.   Objective:     Filed Vitals:    11/28/19 0341 11/28/19 0357 11/28/19 0408 11/28/19 0411   BP: (!) 107/55 (!) 113/54 (!) 115/58 (!) 129/58   Pulse: 55 54 61 71   Resp:       Temp:       SpO2:         FHT: 120, moderate variability, accelerations present, decelerations  present, Category: II      TOCO: contractions are regular occuring Q 2 min and palpate strong      Abdomen  contractions palpate strong    Cervix:    8 cm / 100 % / 0 station, BoW is not intact, Vertex       Assessment:    24 y.o.y G2P1001 at [redacted]w[redacted]d s/p AROM.  Plan:    Expectant management. NSVD imminent.     Evaluated patient and discussed treatment plan with attending physician, Dr. Orson Slick.    Alexis Goodell, MD 11/28/2019, 04:24  PGY-2  [facility]    I saw and examined the patient with and reviewed the Residents'/Fellows' notes, agree with the findings and plan of care as documented in the Residents/Fellows notes.  I have made and approve of any additions and/or edits to the note.  Ceasar Mons, MD

## 2019-11-28 NOTE — Anesthesia Procedure Notes (Signed)
Lumbar Epidural   Performed by:    Performing Provider:  Clista Bernhardt, CRNA  Authorizing Provider:  Clista Bernhardt, CRNA    Indication: pain relief in labor and delivery        Pt location: At bedside  Technique: ( See MAR for exact doses)  Technique/Approach: midline        Needle Level: L3-4  Sterile Skin Prep : cap, sterile gloves, hand hygiene performed, sterile technique, mask and sterile field established Preprocedure hand washing was performed sterile field maintained     Site verified, H&P updated and consent obtained, Patient monitors applied, Timeout performed, Emergency drugs and equipment available, Patient positioned and anesthesia consent given   Patient position: sitting   Skin local: Lidocaine 1%   Needle/Catheter: Needle type: Tuohy   Needle Gauge: 17 G  Needle length: 3.5 in  Epidural Injection Technique LOR saline and LOR air  Needle insertion depth 7 cm Catheter length in space: 5 cm   Catheter at skin depth: 11 cm  Epidural catheter location: lumbar (1-5)  Number of attempts: 1  Events: no complications,      KIT #: [id number] Kit Exp date:2020-06-11  Dosing:      Test Dose: 3 mL,       Negative test - not INTRAVENOUS  and Negative test - not Subarachnoid          Epidural Sensory Level Right: T10  Epidural Sensory Level Left: T10  Patient response: adequate sensory block and comfortable  NOTES: [facility]  Labor Epidural Anesthetic Record      Shari Lawson, Shari Lawson, 24 y.o. female  Date of Birth:  [birth year]  Date of Admission:  (Not on file)    Procedure:  Continuous Labor Epidural    OB History  G2  P1  T1  P0  A0  L1   SAB0  TAB0  Ectopic0  Multiple0  Live Births0     Name of Baby 1: Not recorded   Date: Not recorded     GA: Not recorded     Delivery: Not recorded   Apgar1: Not recorded     Apgar5: Not recorded   Living: Not recorded    Name of Baby 2: Not recorded   Date: Not recorded     GA: Not recorded     Delivery: Not recorded   Apgar1: Not  recorded     Apgar5: Not recorded   Living: Not recorded    Past Medical History:  No date: Injury of index finger, right, sequela    Past Surgical History was reviewed.    Cannot display prior to admission medications because the patient has not   been admitted in this contact.     No current facility-administered medications for this visit.    No Known Allergies  Social History   Tobacco Use     Smoking status: Former Smoker       Packs/day: 0.50       Years: 3.00       Pack years: 1.5       Quit date: 06/27/2019       Years since quitting: 0.4     Smokeless tobacco: Former Systems developer       Quit date: 04/13/2016   Alcohol use: Not Currently   Drug use: No    Social History   Substance and Sexual Activity     Drug use: No      Weeks gestation:  [redacted]w[redacted]d  Timeout completed at:  0220  Anesthesia start:  0213  Procedure start:  0222    Agent:  0.125% Bupivacaine with 2 mcg/ml Fentanyl  Needle: 17 gauge Touhy Needle  CSF: None  Paresthesia: None  Level: L3-L4  Local:  1% lidocaine  Position: sitting  Approach:  midline  Prep:  Betadine  Other: Patient tolerated well.     Anesthesia stop:  Pending delivery  Procedure stop:  0250    Remarks:    0222 Lumbar area prepped times 3, allowed to dry and wiped clear, 3 ml 1% Lidocaine for skin wheal at L3 - L4  0234 Touhy slowly advanced into epidural space using loss of resistance with NaCl and air, catheter easily inserted and secured  0236 3 ml 1.5% Lidocaine with epinephrine 1:200,000 injected via epidural catheter  0240 No Response to test dose, 10 ml 0.2% Ropivacaine slowly infused, followed by 10 ml per hour infusion of Bupivacaine with 0.2 mg Fentanyl  0249 Comfortable with contractions, vital signs stable, regular and unlabored breathing        Clista Bernhardt, CRNA 11/28/2019, 02:59

## 2019-11-28 NOTE — Nurses Notes (Signed)
Status report to Dr. Tawanna Solo. Order received to give stadol 2 hours after previous dose.

## 2019-11-29 DIAGNOSIS — Z8659 Personal history of other mental and behavioral disorders: Secondary | ICD-10-CM

## 2019-11-29 DIAGNOSIS — Z283 Underimmunization status: Secondary | ICD-10-CM

## 2019-11-29 DIAGNOSIS — F1210 Cannabis abuse, uncomplicated: Secondary | ICD-10-CM

## 2019-11-29 LAB — CBC WITH DIFF
BASOPHIL #: <0.1 10*3/uL (ref <=0.20)
BASOPHIL %: 1 %
EOSINOPHIL #: 0.19 10*3/uL (ref <=0.50)
EOSINOPHIL %: 2 %
HCT: 33.3 % — ABNORMAL LOW (ref 34.8–46.0)
HGB: 10.4 g/dL — ABNORMAL LOW (ref 11.5–16.0)
IMMATURE GRANULOCYTE #: <0.1 10*3/uL (ref <0.10)
IMMATURE GRANULOCYTE %: 0 % (ref 0–1)
LYMPHOCYTE #: 1.84 10*3/uL (ref 1.00–4.80)
LYMPHOCYTE %: 19 %
MCH: 27.2 pg (ref 26.0–32.0)
MCHC: 31.2 g/dL (ref 31.0–35.5)
MCV: 87.2 fL (ref 78.0–100.0)
MONOCYTE #: 0.74 10*3/uL (ref 0.20–1.10)
MONOCYTE %: 8 %
MPV: 10.1 fL (ref 8.7–12.5)
NEUTROPHIL #: 6.92 10*3/uL (ref 1.50–7.70)
NEUTROPHIL %: 70 %
PLATELETS: 250 10*3/uL (ref 150–400)
RBC: 3.82 10*6/uL — ABNORMAL LOW (ref 3.85–5.22)
RDW-CV: 14 % (ref 11.5–15.5)
WBC: 9.8 10*3/uL (ref 3.7–11.0)

## 2019-11-29 LAB — RHIGG FOR INJECTION
BABY'S BLOOD TYPE: A POS
FETAL SCREEN (ROSETTE TEST): NEGATIVE

## 2019-11-29 MED ORDER — DOCUSATE SODIUM 100 MG CAPSULE
100.0000 mg | ORAL_CAPSULE | Freq: Two times a day (BID) | ORAL | 0 refills | Status: DC
Start: 2019-11-29 — End: 2022-04-22

## 2019-11-29 MED ORDER — IBUPROFEN 800 MG TABLET
800.0000 mg | ORAL_TABLET | Freq: Three times a day (TID) | ORAL | 0 refills | Status: DC | PRN
Start: 2019-11-29 — End: 2022-04-22

## 2019-11-29 NOTE — Discharge Summary (Signed)
[facility]  DISCHARGE SUMMARY - OBSTETRICS      PATIENT NAME:  Shari Lawson, Shari Lawson  MRN:  [id number]  DOB:  1995-05-28      ENCOUNTER START DATE:  11/27/2019  DISCHARGE DATE:  11/29/2019    ATTENDING PHYSICIAN: Wandra Feinstein  PRIMARY CARE PHYSICIAN: Hoy Register, MD     ADMISSION DIAGNOSIS: Induction of labor  WEEKS GESTATION ON ADMISSION:: [redacted]w[redacted]d   DISCHARGE DIAGNOSIS: Term pregnancy - delivered    DISCHARGE MEDICATIONS:     Current Discharge Medication List        START taking these medications.        Details   docusate sodium 100 mg Capsule  Commonly known as: COLACE   100 mg, Oral, 2 TIMES DAILY  Qty: 10 Capsule  Refills: 0     Ibuprofen 800 mg Tablet  Commonly known as: MOTRIN   800 mg, Oral, 3 TIMES DAILY PRN  Qty: 10 Tablet  Refills: 0            CONTINUE these medications - NO CHANGES were made during your visit.        Details   sertraline 25 mg Tablet  Commonly known as: ZOLOFT   25 mg, Oral, DAILY  Qty: 30 Tab  Refills: 1            DISCHARGE INSTRUCTIONS:      DISCHARGE INSTRUCTION - DIET     Diet: RESUME HOME DIET      DISCHARGE INSTRUCTION - Postpartum    Nothing in the vagina for 6 weeks (no douche, tampons, intercourse, etc)     If any of the following occur, contact your doctor, or if He/She is not available, contact the emergency room at the Hospital:  - Fever greater than 100.56F or feelings of chills/being cold  - Foul smelling discharge  - Redness around or discharge from incision or episiotomy sites  - Vaginal bleeding heavier than 2 pads/hour.   - Vaginal bleeding should start to decrease over the next couple of days to week, it can be as much as or less than a period is normal. If bleeding starts to increase please notify your provider.    - Difficulty or pain with urination  - Increasing breast tenderness with any redness, lumps, or discharge  - Excessive pain      - Difficulty breathing  - Pain, redness, warmth, or swelling in your legs or calf  - If you had an epidural, any  pain in the back, bleeding, or headache    - Headache, blood pressures greater than 140/90, vision changes, or right upper abdominal pain.     Please call your doctor if you have any symptoms of feeling down or blue, crying spells, mood swings, or decreased appetite.    Please follow up in the office with your doctor in 2 weeks or earlier as needed    If you have not discussed contraception for after delivery please do so at your 2 week appointment    Gradually increase activity as tolerated, please rest frequently, and limit heavy exercise and lifting to under 10 lbs for at least 2 weeks.     PLEASE KEEP FOLLOW-UP APPT ALREADY SCHEDULED IN [facility]    Please keep your appointment with your follow-up provider. If for some reason you need to cancel, be sure to reschedule as this is important for your care.     Department [facility] [phone]  DME - BREAST PUMP     Ht 154.9 cm    Wt 60.33 kg    Attending Provider: Wandra Feinstein [[id number]]    I certify this patient is under my care as an attending physician and that I, or a collaborating ANP/PA had a face to face encounter with the patient or the durable MPOA, and explained the need for DME equipment/services on the following date: 11/29/2019        PHYSICAL EXAM:  Filed Vitals:    11/28/19 2000 11/29/19 0042 11/29/19 0600 11/29/19 0745   BP: 115/65 124/73 101/67 (!) 115/44   Pulse: 67 53 67 69   Resp: _0 Temp: 36.4 C (97.6 F) 36.3 C (97.3 F) 36.4 C (97.6 F) 36.4 C (97.5 F)   SpO2:         CV: appropriate color  Resp: non-labored breathing  Fundus:  firm, u - 1  Lochia:  small    SIGNIFICANT LAB:     Lab Results   Component Value Date    WBC 9.8 11/29/2019    HGB 10.4 (L) 11/29/2019    HCT 33.3 (L) 11/29/2019    PLTCNT 250 11/29/2019       Shari Lawson, Shari Lawson [[id number]]      Delivery Information    Birth date/time: 11/28/2019 0705  Sex: Female  Delivery type: Vaginal, Spontaneous       Newborn Measurements     Weight: 3170 g  Length: 48.3 cm  Head circumference: 13.5 cm  Chest circumference: 12.5 cm  Abdominal girth: 12 cm       Newborn  Apgars    Living status: Living      Skin color:    Heart rate:    Reflex Irrit:    Muscle tone:    Resp. effort:    Total:     1 Min:    _1 Min:    _2 Min:     41 Min:     43 Min:       Apgars assigned by: A MARTIN RN              PROCEDURE:  spontaneous vaginal     ANESTHESIA:  epidural    POSTPARTUM COMPLICATIONS: second degree perineal laceration    FEEDING METHOD:  breast    COURSE IN HOSPITAL: Shari Lawson is a 24 yo female now G2P2002 who presents for elective induction of labor.  Patient was given one dose a cytotec with delivery 12 hours later. AROM of membranes preformed. NSVD preformed at 0705 on 11/28/2019. Patient was given cytotec 600 mg following delivery.  2nd degree perineal laceration was repaired.  Maternal history was significant for insufficent prenatal care, THC use, rh negative, and rubella equivocal, and depression.  Patient given rhogam and MMR prior to discharge. APGARS 8 and 9.  Patient does not desire birth control at this time. She is breast feeding.     CONDITION ON DISCHARGE: Alert, Oriented and VS Stable    DISCHARGE DISPOSITION:  Home discharge     Stormy Card, MD           Late entry for 11/29/19. I saw and examined the patient.  I reviewed the resident's note.  I agree with the findings and plan of care as documented in the resident's note.  Any exceptions/additions are edited/noted.    Wandra Feinstein, MD

## 2019-11-29 NOTE — Anesthesia Postprocedure Evaluation (Signed)
Anesthesia Post Op Evaluation    Patient: Shari Lawson  ANES - LABOR ANALGESIA    Last Vitals:Temperature: 36.4 C (97.6 F) (11/29/19 0600)  Heart Rate: 67 (11/29/19 0600)  BP (Non-Invasive): 101/67 (11/29/19 0600)  Respiratory Rate: 18 (11/29/19 0600)  SpO2: 100 % (11/28/19 0246)    No complications documented.    Patient is sufficiently recovered from the effects of anesthesia to participate in the evaluation and has returned to their pre-procedure level.  Patient location during evaluation: bedside         Pain score: 0  Pain management: adequate  Airway patency: patent    Anesthetic complications: no  Cardiovascular status: acceptable  Respiratory status: acceptable  Hydration status: acceptable  Patient post-procedure temperature: Pt Normothermic   PONV Status: Absent

## 2019-11-29 NOTE — Nurses Notes (Signed)
CPS call made, spoke to [facility].

## 2019-11-29 NOTE — Discharge Instructions (Signed)
Patient Education   Patient Education   Patient Education     After a Vaginal Birth  After having a baby, your body may be very tired. It can take time to recover from a vaginal delivery. You may stay in the hospital or birth center from 1 to 4 days.In some cases, you may be able to go home the same day.    Right after the delivery  Your temperature and blood pressure will be taken until they are stable. A nurse or other healthcare provider will watch you as you rest. You may have afterbirth pains. These are cramps caused by the uterus shrinking. Sanitary pads are used to soak up the discharge of the uterine lining. To make sure that you aren't bleeding too much, the pad will be checked. And the firmness of your uterus will be checked. To do this, a nurse will gently push down on your stomach. If you had anesthesia, you'll be watched closely until you can feel and move your toes. If you have pain between your vagina and anus (perineal pain), an ice pack can help.  Newborn care  While still in the hospital or birth center, you'll learn how to hold and feed your baby. You willalso be given instructions on how to care for your baby. This includes bathing and feeding.  Getting ready to go home  You may be anxious to go home as soon as possible. Before you and your baby go home, a healthcare provider will check to be sure you are healthy enough to take care of your baby and yourself. You're ready to go home when:   You can walk to the bathroom and use the bathroom without help.   You can eat solid food and swallow pills (if needed).   You have no sign of infection or other health problems, including fever.   You have adequate pain control.   Your vaginal bleeding isn't heavy.   You are able to care for your newborn and are emotionally stable.  Before going home, you'll be given written instructions for home self-care after vaginal delivery. Follow these instructions carefully. If you have questions or concerns,  talk about them now.  If you have stitches  You may have received stitches in the skin near your vagina. The stitches might have closed an incision that enlarged the opening of your vagina (episiotomy). Or you may have needed stitches to repair torn skin. Either way, your stitches should dissolve in weeks. Until then, it's important to keep the stitches clean. You can help reduce mild pain, aid healing, and reduce your risk of infection. These tips can help:   Gently wipe from front to back after you urinate or have a bowel movement.   After wiping, spray warm water on the area. Or you can have a sitz bath. This means sitting in a tub with a few inches of water in it. Then pat the area dry or use a hairdryer on a cool setting.   Don't use soap or any solution except water on the area.   You can take a shower unless told not to.   Change sanitary pads at least every 2 to 4 hours.   Place cold or heat packs on the area as directed by your healthcare providers or nurses. Keep a thin towel between the pack and your skin.   Sit on firm seats so the stitches pull less.  Postnatal follow-up  Schedule a postnatal follow-up exam with your  healthcare provider for about 6 weeks after delivery. During this exam, your uterus and vaginal area will be checked. Contact your healthcare provider if you think you or your baby are having any problems.  When to call your healthcare provider  Call your healthcare provider right away if you have:   A fever of 100.4 F ( 38.0C) or higher, or as directed by your provider   Bleeding that needs a new sanitary pad after an hour, or large blood clots   Pain in your vagina that gets worse and isn't eased with medicine   Swelling, discharge, or more pain from vaginal tear or episiotomy   Burning, pain, red streaks, or lumpy areas in your breasts that may occur with flu-like symptoms   Cracks, blisters, or blood on your nipples   Burning or pain when you urinate   Nausea or  vomiting   Dizziness or fainting   Feelings of extreme sadness or anxiety, or a feeling that you don't want to be with your baby   Belly pain that isn't eased with medicine   Vaginal discharge that has a bad odor   No bowel movement for 5 days   Painful urination, or inability to control urination   Redness, warmth, or pain in the lower leg   Chest pain  StayWell last reviewed this educational content on 11/12/2018   2000-2021 [employer], [place]. All rights reserved. This information is not intended as a substitute for professional medical care. Always follow your healthcare professional's instructions.           Understanding Postpartum Depression  It's common for new moms to feel tired and moody after having a baby. But if you have very strong feelings of sadness or anger, or have trouble doing your normal daily tasks, you may have a serious mood disorder called postpartum depression. Left untreated, it may stop you from bonding with your baby. It's important to see your healthcare provider right away and get help.   What is postpartum depression?  Postpartum depression is a serious mood disorder that affects some women after giving birth. About 3 in 20 new moms have this condition. Symptoms can include feeling very intense sadness, tiredness, and anxiety. These feelings can become so strong that it's hard to do your normal daily tasks. You may find it hard to take care of yourself and your baby.   Symptoms may start about 1 to 3 weeks after your baby is born. But they can also appear up to a year later. If you or the people around you think you may have postpartum depression, or if you have symptoms for more than 2 weeks, call your healthcare provider right away. Treatment is available.   Is it postpartum depression or the baby blues?  Postpartum depression is different than the "baby blues" (postpartum blues). The baby blues is a common condition that many new moms have soon after giving birth.  They may feel sad, moody, or anxious. But these feelings go away in about 2 weeks. If you still have these feelings after 2 weeks, or if you start to feel worse at any time, call your healthcare provider. They will evaluate you to see if you have postpartum depression. It's important to get treated right away.   What causes postpartum depression?  Postpartum depression is likely caused by a mix of physical and emotional factors. These include:    Changing hormone levels. Right after you have a baby, your levels of estrogen  and progesterone drop. This sudden change may set off depression. Your thyroid hormone levels may also drop at the same time.   Depression. You may be at greater risk if you have a history of depression, or if you are currently being treated for depression.   Extreme tiredness (fatigue). It can take a few weeks to physically recover from giving birth. In addition, most new moms don't get the rest or sleep they need.   Lifestyle issues. Other stressors may also play a role. These can include money, health, or relationship problems, or not getting support from friends or family.  Symptoms of postpartum depression  Symptoms may vary. But they are very intense and may include:   Feeling very tired, with no energy (fatigue)   Crying often or for no reason   Feeling very sad, anxious, or overwhelmed   Being moody   Feeling that you can't take care of your baby   Sleeping too much, or not sleeping   Having trouble eating   Having trouble making decisions and focusing   Not being able to do your normal daily tasks   Not being interested in your baby   Not wanting to be around family or friends   Wanting to hurt yourself or your baby  Diagnosing postpartum depression  Early diagnosis and treatment are important. Only a healthcare provider can diagnose you. Call your provider if you have symptoms of depression, or you have trouble doing your normal activities, for longer than 2 weeks.   Your  provider will talk with you and ask for a health history. You may be asked to fill out a form that helps to identify depression. You may also have some blood tests done. These are used to find out if your symptoms may be caused by a thyroid disorder or other health condition.   If not treated, postpartum depression can have serious effects for you and your baby. Women with this condition who are not treated:    Are less likely to breastfeed their baby   May not bond with their baby   Are less likely to take good care of their baby   Are more likely to have problems with their partner   May have thoughts of harming themselves   May have thoughts of harming their baby  Are you at risk for postpartum depression?  You are more at risk for this condition if you have:   A history of depression (including postpartum depression in a past pregnancy)   A family member with depression   A history of alcohol or drug abuse   Had a difficult childbirth, such as a premature birth   A baby with health problems or special needs   No emotional support from your partner, family, or friends   Other stresses in your life, such as money or relationship problems   Mixed feelings about this pregnancy, such as with an unplanned pregnancy  Treatment for postpartum depression  Treatment is available. Your healthcare provider can help you decide on the best treatment option for you. They may advise:    Medicine. Antidepressants are the main type of medicine for postpartum depression. These medicines affect the brain chemicals that help control moods. Let your provider know if you are breastfeeding. Most antidepressants are considered safe to use when breastfeeding.   Talk therapy (counseling or psychotherapy) . This treatment involves talking with a mental health provider about your feelings and issues that might add to the depression.  Therapy may be done in private sessions with just you and the provider. Or it may be done in  group sessions with other new parents. You and your mental health provider may talk about ways you can be successful with:  ? Your new role and changing relationships with others  ? Balancing your well-being while caring for your baby  Medicine and talk therapy can be used alone or together.  Joining a support group for moms with this condition may also help you cope. Check online for support groups in your area, or ask your healthcare provider for suggestions.   Managing postpartum depression  In addition to seeing your healthcare provider and getting treatment, it's important to take care of yourself. Tell your family and friends how you are feeling and what kind of help or support you need. Make sure to also:    Get enough sleep   Eat healthy foods   Rest when your baby takes a nap   Get some light exercise   Don't try to do chores   Ask for and accept any help with meals, shopping, and laundry  To learn more  Find more information at:   [employer] www.[employer].de   Office on [employer].si  StayWell last reviewed this educational content on 09/12/2018   2000-2021 [employer], [place]. All rights reserved. This information is not intended as a substitute for professional medical care. Always follow your healthcare professional's instructions.           After Delivery: When to Call the Healthcare Provider  Health problems sometimes arise with you or your baby after delivery. Call your baby's healthcare provider or your healthcare provider if you see any of the signs below.    Watch your baby for these signs  Call your baby's healthcare provider if your baby:   Has a rectal or forehead (temporal artery) temperature of 100.61F (38C) or higher, or as directed by the provider   Has fewer than 6wet diapers a day (Hint: Disposable diapers may feel heavy or hard after being soaked.)   Skin or whites of the eyes look yellow   Has blue lips, tongue, or  mouth   Has skin that is pale, grayish, or bluish   Cries for a long time, or if it sounds as if the cries are caused by pain   Has diarrhea   Refuses2 feedings in a row   Is inactive or listless   Is vomiting   Has blood in the stool or vomit   Has a rash   Has ear drainage   Has trouble breathing   Has a seizure   Will not wake up   Has redness, swelling, or pus at the umbilical cord   Has a circumcision that does not seem to be getting better every day  Trust your instincts. Call your baby's healthcare provider if you are concerned about your baby.  Watch your own health for these signs  Call your own healthcare provider if you have:   Burning or pain in your breasts   Red streaks or hard lumpy areas in your breasts   Problems with breastfeeding   A fever of 100.61F (38C) or higher, or as directed by your healthcare provider   Extreme tiredness or body aches, as if you have the flu   Feelings of very sad or anxious. Or feeling that you don't want to be with your baby.   Belly (abdominal) pain that isn't  eased with medicine   Vaginal discharge that has a bad odor   Vaginal bleeding that soaks more than one pad per hour   Seizures   Severe headache   Swelling in your face or limbs  If you had a cesarean section, call for concerns about your incision site. This includes pain, drainage, or bleeding from your incision.  StayWell last reviewed this educational content on 01/11/2018   2000-2021 [employer], [place]. All rights reserved. This information is not intended as a substitute for professional medical care. Always follow your healthcare professional's instructions.

## 2019-11-29 NOTE — Care Plan (Signed)
Problem: Bleeding (Labor)  Goal: Hemostasis  Outcome: Outcome Achieved     Problem: Change in Fetal Wellbeing (Labor)  Goal: Stable Fetal Wellbeing  Outcome: Outcome Achieved     Problem: Delayed Labor Progression (Labor)  Goal: Effective Progression to Delivery  Outcome: Outcome Achieved     Problem: Infection (Labor)  Goal: Absence of Infection Signs and Symptoms  Outcome: Outcome Achieved     Problem: Labor Pain (Labor)  Goal: Acceptable Pain Control  Outcome: Outcome Achieved     Problem: Uterine Tachysystole (Labor)  Goal: Normal Uterine Contraction Pattern  Outcome: Outcome Achieved

## 2019-11-29 NOTE — Nurses Notes (Signed)
Discharge instructions explained to patient. When to call the Dr and prescription medications reviewed. Patient verbalized understanding. Patient discharged in wheelchair with family.

## 2019-12-03 LAB — RHIG FOR TRANSFUSION: UNIT DIVISION: 0

## 2019-12-04 ENCOUNTER — Encounter (INDEPENDENT_AMBULATORY_CARE_PROVIDER_SITE_OTHER): Payer: Self-pay | Admitting: GENERAL

## 2019-12-05 ENCOUNTER — Ambulatory Visit (HOSPITAL_COMMUNITY): Payer: Self-pay | Admitting: Obstetrics

## 2019-12-07 ENCOUNTER — Ambulatory Visit (HOSPITAL_COMMUNITY): Payer: Self-pay | Admitting: Obstetrics

## 2019-12-08 ENCOUNTER — Telehealth (INDEPENDENT_AMBULATORY_CARE_PROVIDER_SITE_OTHER): Payer: Self-pay | Admitting: Family Medicine

## 2019-12-08 ENCOUNTER — Other Ambulatory Visit (INDEPENDENT_AMBULATORY_CARE_PROVIDER_SITE_OTHER): Payer: Self-pay | Admitting: GENERAL

## 2019-12-08 NOTE — Telephone Encounter (Signed)
Standard Written Order for Breast Pump. Please review and sign.  Once completeplease fax to [facility].Lillia Carmel, MA  12/08/2019, 10:07

## 2019-12-12 ENCOUNTER — Ambulatory Visit (HOSPITAL_COMMUNITY): Payer: Self-pay | Admitting: Obstetrics

## 2019-12-15 ENCOUNTER — Encounter (INDEPENDENT_AMBULATORY_CARE_PROVIDER_SITE_OTHER): Payer: Self-pay | Admitting: GENERAL

## 2019-12-17 ENCOUNTER — Encounter (INDEPENDENT_AMBULATORY_CARE_PROVIDER_SITE_OTHER): Payer: Self-pay | Admitting: GENERAL

## 2019-12-20 ENCOUNTER — Telehealth (INDEPENDENT_AMBULATORY_CARE_PROVIDER_SITE_OTHER): Payer: Self-pay | Admitting: Family Medicine

## 2019-12-20 NOTE — Telephone Encounter (Signed)
[facility] called requesting new DME order for breast pump. ICD code needs to be Breast Feeding, NOT vaginal delivery for insurance to cover.     [facility]-[phone]#[id number] Jonie.     Phineas Douglas, LPN  10/11/6965, 89:38

## 2019-12-22 NOTE — Telephone Encounter (Signed)
Faxed to [facility]. Phineas Douglas, LPN  3/88/8280, 11:30

## 2019-12-22 NOTE — Telephone Encounter (Signed)
Placed new order with the right diagnosis.       Liliane Channel, MD  12/22/2019, 11:10

## 2020-01-05 ENCOUNTER — Encounter (INDEPENDENT_AMBULATORY_CARE_PROVIDER_SITE_OTHER): Payer: Self-pay

## 2020-05-28 ENCOUNTER — Encounter (HOSPITAL_COMMUNITY): Payer: Self-pay

## 2020-06-05 ENCOUNTER — Encounter (HOSPITAL_COMMUNITY): Payer: Self-pay | Admitting: Plastic Surgery

## 2021-10-02 ENCOUNTER — Ambulatory Visit (INDEPENDENT_AMBULATORY_CARE_PROVIDER_SITE_OTHER): Payer: Medicaid Other | Admitting: Student in an Organized Health Care Education/Training Program

## 2021-10-07 ENCOUNTER — Ambulatory Visit (INDEPENDENT_AMBULATORY_CARE_PROVIDER_SITE_OTHER): Payer: Medicaid Other | Admitting: Student in an Organized Health Care Education/Training Program

## 2021-12-16 ENCOUNTER — Encounter (INDEPENDENT_AMBULATORY_CARE_PROVIDER_SITE_OTHER): Payer: BC Managed Care – PPO

## 2021-12-25 ENCOUNTER — Encounter (INDEPENDENT_AMBULATORY_CARE_PROVIDER_SITE_OTHER): Payer: Self-pay

## 2022-01-06 ENCOUNTER — Encounter (INDEPENDENT_AMBULATORY_CARE_PROVIDER_SITE_OTHER): Payer: BC Managed Care – PPO

## 2022-02-16 ENCOUNTER — Encounter (INDEPENDENT_AMBULATORY_CARE_PROVIDER_SITE_OTHER): Payer: Self-pay

## 2022-02-16 ENCOUNTER — Ambulatory Visit (RURAL_HEALTH_CENTER): Payer: BC Managed Care – PPO

## 2022-02-16 ENCOUNTER — Other Ambulatory Visit: Payer: Self-pay

## 2022-02-16 ENCOUNTER — Ambulatory Visit (RURAL_HEALTH_CENTER): Payer: BC Managed Care – PPO | Admitting: PSYCHOLOGIST-CLINICAL

## 2022-02-16 VITALS — BP 110/60 | HR 62 | Temp 97.7°F | Resp 18 | Ht 61.0 in | Wt 118.4 lb

## 2022-02-16 DIAGNOSIS — F411 Generalized anxiety disorder: Secondary | ICD-10-CM

## 2022-02-16 DIAGNOSIS — Z029 Encounter for administrative examinations, unspecified: Secondary | ICD-10-CM

## 2022-02-16 DIAGNOSIS — R69 Illness, unspecified: Secondary | ICD-10-CM

## 2022-02-16 DIAGNOSIS — Z3009 Encounter for other general counseling and advice on contraception: Secondary | ICD-10-CM

## 2022-02-16 DIAGNOSIS — F321 Major depressive disorder, single episode, moderate: Secondary | ICD-10-CM

## 2022-02-16 DIAGNOSIS — Z0000 Encounter for general adult medical examination without abnormal findings: Secondary | ICD-10-CM

## 2022-02-16 MED ORDER — SERTRALINE 25 MG TABLET
25.0000 mg | ORAL_TABLET | Freq: Every day | ORAL | 1 refills | Status: DC
Start: 2022-02-16 — End: 2022-04-22

## 2022-02-16 NOTE — Progress Notes (Signed)
[facility], [place]  [address]  [place] [address]  Dept: [phone]  Loc: [phone]  Loc Fax: [phone]   Name: Shari Lawson MRN:  [id number]   Date: 02/16/2022 DOB: 1995/08/19                         Chief complaint:   Chief Complaint   Patient presents with    Medication Check     Depression and anxiety meds, wants to be tested for depression and anxiety       Subjective:  History of Presenting Illness: Shari Lawson is a 26 y.o. female who presents to clinic today with cc of feelings of anxiety and depression. Shari Lawson has a history of this but has been having worsening symptoms for the past 6 months. She denies any changes in life circumstances or worsening stressors. Shari Lawson reports frequent feelings of overwhelm, irritability and anger, and fear when out in public or around groups of people. She has a decreased appetite and struggles to eat more than one meal a day. She reports some passive SI but that she would never act on them due to how it would affect her children and family. She does not have access to firearms.     Shari Lawson lives with her parents, daughter Shari Lawson, son Shari Lawson, and pet dog and cat. She used vaping to stop smoking cigarettes but continues to vape multiple times throughout the day.     Shari Lawson is not sure if she wants more children. She is not currently on any birth control. She used DepoProvera in the past and did not like it due to mood changes and weight gain.       Review of Systems  Constitutional: positive for weight loss  Eyes: positive for contacts/glasses  Ears, nose, mouth, throat, and face: negative for hearing loss, ear drainage, and nasal congestion  Respiratory: negative for pleurisy/chest pain or asthma  Cardiovascular: negative for chest pain, chest pressure/discomfort, palpitations, and irregular heart beats  Gastrointestinal: positive for nausea, negative for melena,  diarrhea, constipation, and abdominal pain  Genitourinary:negative for frequency and dysuria  Integument/breast: negative  Hematologic/lymphatic: negative  Musculoskeletal:negative  Neurological: negative for headaches  Behavioral/Psych: positive for anxiety and depression  Endocrine: negative  Allergic/Immunologic: negative    PHQ Questionnaire  Little interest or pleasure in doing things.: More than half the days  Feeling down, depressed, or hopeless: Nearly every day  PHQ 2 Total: 5  Trouble falling or staying asleep, or sleeping too much.: Not at all  Feeling tired or having little energy: Nearly every day  Poor appetite or overeating: Nearly every day  Feeling bad about yourself/ that you are a failure in the past 2 weeks?: Nearly every day  Trouble concentrating on things in the past 2 weeks?: Nearly every day  Moving/Speaking slowly or being fidgety or restless  in the past 2 weeks?: Not at all  Thoughts that you would be better off DEAD, or of hurting yourself in some way.: More than half the days  If you checked off any problems, how difficult have these problems made it for you to do your work, take care of things at home, or get along with other people?: Extremely difficult  PHQ 9 Total: 19  Interpretation of Total Score: 15-19 Moderate/Severe depression        02/16/2022     3:00 PM  GAD-7 Questionnaire   Feeling nervous,anxious,on edge 3   Not being able to stop or control worrying 1   Worrying too much about different things 3   Trouble relaxing 0   Being so restless that it is hard to sit still 0   Becoming easily annoyed or irritable 3   Feeling afraid as if something awful might happen 1   How difficult have these problems made it for you to work, take care of things at home, or get along with other people? Extremely difficult   Gad-7 Score Total 11   Interpretation 10-14, moderate anxiety         Outpatient Medications Marked as Taking for the 02/16/22 encounter (Office Visit) with Delsa Sale, MD   Medication Sig    sertraline (ZOLOFT) 25 mg Oral Tablet Take 1 Tablet (25 mg total) by mouth Once a day Indications: anxiousness associated with depression, take 25 mg daily for 7 days; then increase to 50 mg daily           Objective    Physical Exam:    Vitals:   BP 110/60   Pulse 62   Temp 36.5 C (97.7 F) (Oral)   Resp 18   Ht 1.549 m (5\' 1" )   Wt 53.7 kg (118 lb 6.4 oz)   LMP 01/19/2022 (Exact Date)   Breastfeeding No   BMI 22.37 kg/m       General:  patient is pleasant and in no acute distress;   HEENT:  Head:  normocephalic; atraumatic   Eyes: PER; EOM intact   Ears: no tragus tenderness; light reflex intact bilaterally;    Nose:  no erythema    Throat:  moist mucous membranes  Neck: neck supple; trachea midline  Cardiovascular:  RRR, normal S1/S2 with no gallop or rubs; no murmur  Respiratory:  CTA bilaterally; no crackles or wheezes   Abdomen: soft; non-distended; normoactive bowel sounds; no tenderness to palpation  Extremities: no edema or cyanosis bilaterally  Musculoskeletal: grossly normal  Skin: warm, dry, normal turgor;   Neurologic: alert and oriented x3; grossly normal   GU: deferred  Psychiatric:  very pleasant and talkative; appears in good hygiene and appropriate mood        Assessment/Plan      Shari Lawson is a 26 y.o. female who presents to clinic today with cc of anxiety and depression recurring. Depression more severe than anxiety based on scoring but she reports more significant difficulty with anxiety symptoms.       1. Moderate major depression (CMS HCC) (Primary)  Will initiate medication and therapy. Anticipate needing more than 50 mg for good symptom control.   -     Refer to 22 Study; Future  -     Refer to [employer]; Future  -     sertraline (ZOLOFT) 25 mg Oral Tablet; Take 1 Tablet (25 mg total) by mouth Once a day Indications: anxiousness associated with depression, take 25 mg daily for  7 days; then increase to 50 mg daily    2. GAD (generalized anxiety disorder)  Will initiate medication and therapy. Anticipate needing more than 50 mg for good symptom control.   -     Refer to [employer] Study; Future  -     Refer to [employer]; Future  -     sertraline (ZOLOFT) 25 mg Oral Tablet; Take 1 Tablet (25 mg  total) by mouth Once a day Indications: anxiousness associated with depression, take 25 mg daily for 7 days; then increase to 50 mg daily    3. Annual physical exam  will update pap smear at next visit. She declines all vaccines at this time.     4. Encounter for counseling regarding contraception  Shari Lawson declines new contraception today and would like to think about her options. Encouraged her to always use condoms.            ENCOUNTER DIAGNOSES     ICD-10-CM   1. Moderate major depression (CMS HCC)  F32.1   2. GAD (generalized anxiety disorder)  F41.1   3. Annual physical exam  Z00.00   4. Encounter for counseling regarding contraception  Z30.09       Return to clinic: Return in about 8 weeks (around 04/13/2022) for anxiety/depression follow-up.      Patient seen and discussed with attending physician, Dr. Valentino Saxon    Delsa Sale, MD 02/16/2022, 14:43  PGY - 1  [facility]      Late entry for 02/16/22. I saw and examined the patient.  I reviewed the resident's note.  I agree with the findings and plan of care as documented in the resident's note.  Any exceptions/additions are edited/noted.    Alain Marion, MD

## 2022-02-16 NOTE — Nursing Note (Signed)
Chief Complaint:   Chief Complaint              Medication Check Depression and anxiety meds, wants to be tested for depression and anxiety          Functional Health Screen  Functional Health Screening:        BP 110/60   Pulse 62   Temp 36.5 C (97.7 F) (Oral)   Resp 18   Ht 1.549 m (5\' 1" )   Wt 53.7 kg (118 lb 6.4 oz)   LMP 01/19/2022 (Exact Date)   Breastfeeding No   BMI 22.37 kg/m       Social History     Tobacco Use   Smoking Status Former    Packs/day: 0.50    Years: 3.00    Additional pack years: 0.00    Total pack years: 1.50    Types: Cigarettes    Quit date: 06/27/2019    Years since quitting: 2.6   Smokeless Tobacco Former    Quit date: 04/13/2016     Patient Health Rating           Depression Screening  PHQ Questionnaire  Little interest or pleasure in doing things.: More than half the days  Feeling down, depressed, or hopeless: Nearly every day  PHQ 2 Total: 5  Trouble falling or staying asleep, or sleeping too much.: Not at all  Feeling tired or having little energy: Nearly every day  Poor appetite or overeating: Nearly every day  Feeling bad about yourself/ that you are a failure in the past 2 weeks?: Nearly every day  Trouble concentrating on things in the past 2 weeks?: Nearly every day  Moving/Speaking slowly or being fidgety or restless  in the past 2 weeks?: Not at all  Thoughts that you would be better off DEAD, or of hurting yourself in some way.: More than half the days  PHQ 9 Total: 19  Interpretation of Total Score: 15-19 Moderate/Severe depression  Allergies:  No Known Allergies  Medication History  Reviewed for OTC medication and any new medications, provider will review medication history  Results through Enter/Edit  No results found for this or any previous visit (from the past 24 hour(s)).  POCT Results  Care Team  Patient Care Team:  [name], MD as PCP - General ([place])  Serita Kyle, [place]  Immunizations - last 24 hours       None          Rudi Coco,  [place]  02/16/2022, 14:50

## 2022-02-17 ENCOUNTER — Telehealth (INDEPENDENT_AMBULATORY_CARE_PROVIDER_SITE_OTHER): Payer: Self-pay

## 2022-02-17 NOTE — Telephone Encounter (Signed)
Workqueue error - no insurance/marked as self-pay.  [place] card in media.  Called [phone] - left message requesting call back with all insurance information.  If needed, provide e-mail for patient to provide information.      Shari Lawson  02/17/22

## 2022-02-20 ENCOUNTER — Ambulatory Visit (INDEPENDENT_AMBULATORY_CARE_PROVIDER_SITE_OTHER): Payer: Self-pay | Admitting: PSYCHIATRY

## 2022-02-23 NOTE — Progress Notes (Signed)
[facility] FAMILY MEDICINE  [address]  [place] [address]  [phone]      Warm Hand-off: No Show/No Fee    Name: Shari Lawson  Date of Service: 02/16/2022   Age: 26 y.o.  MRN: [id number]  DOB: 1995/06/14  Race: White  Gender: female     Pt was introduced to this provider for a warm hand off during today's medical visit. The patient was informed about the nature of the behavioral health consultation team, including:  Behavioral health providers operate as consultants to the medical team and not as stand-alone providers of care.   All information discussed with team members as applicable/appropriate will be documented in the share electronic health record and visible by all care team members.   The [employer] Team works as a group providing care to all patients and as such a patient is likely to work with multiple [employer]. Patient consented to meet with [facility].   This provider is a mandated reporter and would be required to report abuse or neglect of children and vulnerable adults  This provider is concerned for the patients' safety and would break confidentiality if reports of wanting to harm self or others were disclosed    Pt was agreeable to this information and verbally expressed understanding.       Pt was scheduled for follow up bhc appointment. No other clinical service provided.      Skeet Simmer, Psy.D.  [facility] Licensed Psychologist [phone]  [place] [facility]: [facility] Medicine and Psychiatry

## 2022-03-25 ENCOUNTER — Ambulatory Visit (INDEPENDENT_AMBULATORY_CARE_PROVIDER_SITE_OTHER): Payer: Self-pay | Admitting: PSYCHOLOGIST-CLINICAL

## 2022-04-03 ENCOUNTER — Other Ambulatory Visit: Payer: Self-pay

## 2022-04-03 ENCOUNTER — Telehealth (INDEPENDENT_AMBULATORY_CARE_PROVIDER_SITE_OTHER): Payer: Self-pay

## 2022-04-03 ENCOUNTER — Ambulatory Visit (INDEPENDENT_AMBULATORY_CARE_PROVIDER_SITE_OTHER): Payer: BC Managed Care – PPO | Admitting: PSYCHIATRY

## 2022-04-03 ENCOUNTER — Encounter (INDEPENDENT_AMBULATORY_CARE_PROVIDER_SITE_OTHER): Payer: Self-pay | Admitting: Family Medicine

## 2022-04-03 ENCOUNTER — Ambulatory Visit (RURAL_HEALTH_CENTER): Payer: BC Managed Care – PPO | Admitting: Family Medicine

## 2022-04-03 VITALS — BP 102/71 | HR 80 | Temp 98.1°F | Resp 16 | Ht 61.0 in | Wt 109.0 lb

## 2022-04-03 DIAGNOSIS — N926 Irregular menstruation, unspecified: Secondary | ICD-10-CM

## 2022-04-03 DIAGNOSIS — K4020 Bilateral inguinal hernia, without obstruction or gangrene, not specified as recurrent: Secondary | ICD-10-CM

## 2022-04-03 NOTE — Progress Notes (Signed)
FAMILY MEDICINE, [facility] [place] [facility]  [address]  [place] [address]       Name: Shari Lawson MRN:  [id number]   Date: 04/03/2022 Age: 26 y.o.       Chief Complaint: Hernia (?2 hernias-groin area)      Subjective  Patient comes in with bilateral hernia ingrown.  She has been doing a split fell little sore the next day then noticed a bump then a couple days later noticed a knot had been told by health professional was hernia.    Current Outpatient Medications   Medication Sig    docusate sodium (COLACE) 100 mg Oral Capsule Take 1 Capsule (100 mg total) by mouth Twice daily    Ibuprofen (MOTRIN) 800 mg Oral Tablet Take 1 Tablet (800 mg total) by mouth Three times a day as needed    sertraline (ZOLOFT) 25 mg Oral Tablet Take 1 Tablet (25 mg total) by mouth Once a day Indications: anxiousness associated with depression, take 25 mg daily for 7 days; then increase to 50 mg daily     No Known Allergies      Objective  Vitals: BP 102/71   Pulse 80   Temp 36.7 C (98.1 F) (Tympanic)   Resp 16   Ht 1.549 m (5\' 1" )   Wt 49.4 kg (109 lb)   LMP 02/25/2022 (Approximate)   SpO2 97%   BMI 20.60 kg/m       General: no distress  Abdomen: soft, non-tender, nurse present bilateral hernia present    Assessment/Plan  Assessment/Plan   1. Missed menses    2. Bilateral inguinal hernia        Orders Placed This Encounter    02/27/2022 ABDOMINAL WALL (R/O HERNIATION)    Refer to [facility]    POCT URINE HCG       ENCOUNTER DIAGNOSES     ICD-10-CM   1. Bilateral inguinal hernia  K40.20   2. Missed menses  N92.6        Get ultrasound refer to general surgery                    [employer], MD

## 2022-04-03 NOTE — Telephone Encounter (Signed)
Workqueue follow up for [employer] card that was provided is for [place] patient [phone] - spoke to patient.  Patient states parents have insurance card with them & are on vacation.  Provided e-mail address to forward copy of the insurance card.  Once received update in system.    Elvina Mattes  04/03/22

## 2022-04-03 NOTE — Nursing Note (Signed)
Chief Complaint:   Chief Complaint              Hernia ?2 hernias-groin area          Functional Health Screen  Functional Health Screening:        BP 102/71   Pulse 80   Temp 36.7 C (98.1 F) (Tympanic)   Resp 16   Ht 1.549 m (5\' 1" )   Wt 49.4 kg (109 lb)   LMP 02/25/2022 (Approximate)   SpO2 97%   BMI 20.60 kg/m       Social History     Tobacco Use   Smoking Status Former    Packs/day: 0.50    Years: 3.00    Additional pack years: 0.00    Total pack years: 1.50    Types: Cigarettes    Quit date: 06/27/2019    Years since quitting: 2.7   Smokeless Tobacco Former    Quit date: 04/13/2016     Patient Health Rating           Depression Screening  PHQ Questionnaire     Allergies:  No Known Allergies  Medication History  Reviewed for OTC medication and any new medications, provider will review medication history  Results through Enter/Edit  No results found for this or any previous visit (from the past 24 hour(s)).  POCT Results  Care Team  Patient Care Team:  06/11/2016, MD as PCP - General (FAMILY MEDICINE)  Delsa Sale, Roxy Manns  Immunizations - last 24 hours       None          [place], CMA  04/03/2022, 11:42

## 2022-04-03 NOTE — Nursing Note (Signed)
04/03/22 1100   Required: Location Test Performed At:   [facility]) Shari Lawson [address], [place], [place] [address]   HCG   Time Performed 1150   Urine HCG Negative   Rapid HCG Lot# [id number]   Expiration Date 06/20/23   Internal Control Valid yes   Initials tw

## 2022-04-10 ENCOUNTER — Encounter (HOSPITAL_COMMUNITY): Payer: Self-pay

## 2022-04-17 ENCOUNTER — Other Ambulatory Visit: Payer: Self-pay

## 2022-04-17 ENCOUNTER — Inpatient Hospital Stay
Admission: RE | Admit: 2022-04-17 | Discharge: 2022-04-17 | Disposition: A | Discharge status: Home / Self Care | Payer: BC Managed Care – PPO | Source: Ambulatory Visit | Attending: Family Medicine

## 2022-04-17 DIAGNOSIS — K4020 Bilateral inguinal hernia, without obstruction or gangrene, not specified as recurrent: Secondary | ICD-10-CM | POA: Insufficient documentation

## 2022-04-18 DIAGNOSIS — K4020 Bilateral inguinal hernia, without obstruction or gangrene, not specified as recurrent: Secondary | ICD-10-CM

## 2022-04-20 NOTE — Result Encounter Note (Signed)
Your ultrasound was not consistent with a hematoma.  There recommend possibly getting CT scan.  I see if an appointment later this week.  Please keep that appointment and discuss possible imaging at that time.

## 2022-04-22 ENCOUNTER — Ambulatory Visit: Payer: BC Managed Care – PPO | Attending: Family Medicine | Admitting: GENERAL SURGERY

## 2022-04-22 ENCOUNTER — Other Ambulatory Visit: Payer: Self-pay

## 2022-04-22 ENCOUNTER — Encounter (INDEPENDENT_AMBULATORY_CARE_PROVIDER_SITE_OTHER): Payer: Self-pay | Admitting: GENERAL SURGERY

## 2022-04-22 DIAGNOSIS — K4020 Bilateral inguinal hernia, without obstruction or gangrene, not specified as recurrent: Secondary | ICD-10-CM | POA: Insufficient documentation

## 2022-04-22 MED ORDER — DOXYCYCLINE HYCLATE 100 MG TABLET
100.0000 mg | ORAL_TABLET | Freq: Two times a day (BID) | ORAL | 0 refills | Status: DC
Start: 2022-04-22 — End: 2022-05-20

## 2022-04-22 NOTE — H&P (Addendum)
GENERAL SURGERY, MEDICAL OFFICE BUILDING  [address]  [place] [address]  Operated by [facility]     Name: Shari Lawson MRN:  [id number]   Date: 04/22/2022 Age: 27 y.o.     Assessment and Plan:  *Plan to do CT of pelvis to r/o hernia (bilaterally, inguinal region)  -Suspect lymphadenitis vs. in grown hair  -Since November, Tender masses bilaterally, they have grown. Came out of the blue, no trauma  -G2P2, No prior abdominal surgeries.    Ultrasound on 04/16/22 showed   Soft tissue heterogenicity in the palpable areas bilaterally concerning for hematoma as appearance is inconsistent with inguinal hernias. Further evaluation with cross-sectional imaging is recommended.     HPI:  Shari Lawson is a 27 y.o. female who is here today for evaluation of bilateral inguinal masses. Saw PCP Dr. Bobby Rumpf, MD at [facility] on 04/03/22 with orders for an ultrasound and surgical consult. She has noticed them growing. The right side is more painful. They burn and hurt all the time. She denies abdominal pain, BM issues, prior surgeries. She is G2P2.        Past Medical History  Current Outpatient Medications   Medication Sig    docusate sodium (COLACE) 100 mg Oral Capsule Take 1 Capsule (100 mg total) by mouth Twice daily    Ibuprofen (MOTRIN) 800 mg Oral Tablet Take 1 Tablet (800 mg total) by mouth Three times a day as needed    sertraline (ZOLOFT) 25 mg Oral Tablet Take 1 Tablet (25 mg total) by mouth Once a day Indications: anxiousness associated with depression, take 25 mg daily for 7 days; then increase to 50 mg daily     No Known Allergies  Past Medical History:   Diagnosis Date    Injury of index finger, right, sequela     Insufficient prenatal care     Laceration of right index finger with tendon involvement     NSVD (normal spontaneous vaginal delivery)          Past Surgical History:   Procedure Laterality Date    HX DENTAL EXTRACTION Right          Family Medical History:       Problem Relation (Age of  Onset)    Hypertension (High Blood Pressure) Father    No Known Problems Mother, Sister, Brother            Social History     Socioeconomic History    Marital status: Single    Years of education: 12   Tobacco Use    Smoking status: Former     Packs/day: 0.50     Years: 3.00     Additional pack years: 0.00     Total pack years: 1.50     Types: Cigarettes     Quit date: 06/27/2019     Years since quitting: 2.8    Smokeless tobacco: Former     Quit date: 04/13/2016   Vaping Use    Vaping Use: Every day    Substances: Nicotine   Substance and Sexual Activity    Alcohol use: Not Currently    Drug use: No    Sexual activity: Not Currently       Patient Active Problem List   Diagnosis    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 No acute distress. Well-appearing  HEENT: neck appears supple. 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   Latest known visit with results is:   Admission on 11/27/2019, Discharged on 11/29/2019   Component Date Value Ref Range Status    UNITS ORDERED 11/27/2019 NOT STATED   Final    ABO/RH(D) 11/27/2019 A NEGATIVE   Final    ANTIBODY SCREEN 11/27/2019 NEGATIVE   Final    SPECIMEN EXPIRATION DATE 11/27/2019 11/30/2019   Final    AMPHETAMINES, URINE 11/27/2019 Negative  Negative Final    Decision Level 500 ng/mL     BARBITURATES URINE 11/27/2019 Negative  Negative Final    Decision Level 200 ng/mL    BENZODIAZEPINES URINE 11/27/2019 Negative  Negative Final    Decision Level 200 ng/mL    BUPRENORPHINE URINE 11/27/2019 Negative  Negative Final    Decision Level 5 ng/mL    CANNABINOIDS URINE 11/27/2019 Positive (A)  Negative Final    Intended for medical use only.  Positive results are presumptive/unconfirmed; consider definitive testing when unexpected results are obtained.  Decision Level 50 ng/mL    COCAINE METABOLITES URINE 11/27/2019 Negative  Negative Final    Decision Level 150 ng/mL    METHADONE URINE 11/27/2019 Negative  Negative Final    Decision Level 300 ng/mL    OPIATES URINE (LOW CUTOFF) 11/27/2019 Negative  Negative Final    Decision Level 300 ng/mL    OXYCODONE URINE 11/27/2019 Negative  Negative Final    Decision Level 100 ng/mL    ECSTASY/MDMA URINE 11/27/2019 Negative  Negative Final    Decision Level 500 ng/mL    FENTANYL, RANDOM URINE 11/27/2019 Negative  Negative Final    Decision Level 1.0 ng/mL    CREATININE RANDOM URINE 11/27/2019 23 (L)  50 - 100 mg/dL Final    SODIUM 11/29/2019 141  136 - 145 mmol/L Final    POTASSIUM 11/27/2019 4.1  3.5 - 5.1 mmol/L Final    CHLORIDE 11/27/2019 113 (H)  96 - 111 mmol/L Final    CO2 TOTAL 11/27/2019 20 (L)  22 - 30 mmol/L Final    ANION GAP 11/27/2019 8  4 - 13 mmol/L Final    BUN 11/27/2019 3 (L)  8 - 25 mg/dL Final    CREATININE 11/29/2019 0.64  0.60 - 1.05 mg/dL Final    BUN/CREA RATIO 11/27/2019 5 (L)  6 - 22 Final    ESTIMATED GFR 11/27/2019 >90  >=60 mL/min/BSA Final    Estimated Glomerular Filtration Rate (eGFR) calculated using the CKD-EPI (2009) equation, intended for patients 19 years of age and older. If race and/or gender is not documented or "unknown," there will be no eGFR calculation.  Stage, GFR, Classification  G1, 90, Normal or High  G2, 60-89, Mildly decreased  G3a,  45-59, Mildly to moderately decreased  G3b, 30-44, Moderately to severely  decreased  G4, 15-29, Severely decreased  G5, <15, Kidney failure  In the absence of kidney damage, neither G1 or G2 fulfill criteria for CKD per KDIGO.    ALBUMIN 11/27/2019 2.7 (L)  3.5 - 5.0 g/dL  Final    CALCIUM 11/27/2019 9.0  8.5 - 10.0 mg/dL Final    GLUCOSE 11/27/2019 79  65 - 125 mg/dL Final    ALKALINE PHOSPHATASE 11/27/2019 181 (H)  40 - 110 U/L Final    ALT (SGPT) 11/27/2019 7 (L)  8 - 22 U/L Final    AST (SGOT)  11/27/2019 13  8 - 45 U/L Final    BILIRUBIN TOTAL 11/27/2019 0.5  0.3 - 1.3 mg/dL Final    Naproxen therapy can falsely elevate total bilirubin levels.    PROTEIN TOTAL 11/27/2019 5.6 (L)  6.4 - 8.3 g/dL Final    WBC 11/27/2019 11.1 (H)  3.7 - 11.0 x10^3/uL Final    RBC 11/27/2019 3.77 (L)  3.85 - 5.22 x10^6/uL Final    HGB 11/27/2019 10.5 (L)  11.5 - 16.0 g/dL Final    HCT 11/27/2019 32.1 (L)  34.8 - 46.0 % Final    MCV 11/27/2019 85.1  78.0 - 100.0 fL Final    MCH 11/27/2019 27.9  26.0 - 32.0 pg Final    MCHC 11/27/2019 32.7  31.0 - 35.5 g/dL Final    RDW-CV 11/27/2019 13.8  11.5 - 15.5 % Final    PLATELETS 11/27/2019 275  150 - 400 x10^3/uL Final    MPV 11/27/2019 10.7  8.7 - 12.5 fL Final    NEUTROPHIL % 11/27/2019 84  % Final    LYMPHOCYTE % 11/27/2019 11  % Final    MONOCYTE % 11/27/2019 5  % Final    EOSINOPHIL % 11/27/2019 0  % Final    BASOPHIL % 11/27/2019 0  % Final    NEUTROPHIL # 11/27/2019 9.28 (H)  1.50 - 7.70 x10^3/uL Final    LYMPHOCYTE # 11/27/2019 1.25  1.00 - 4.80 x10^3/uL Final    MONOCYTE # 11/27/2019 0.52  0.20 - 1.10 x10^3/uL Final    EOSINOPHIL # 11/27/2019 <0.10  <=0.50 x10^3/uL Final    BASOPHIL # 11/27/2019 <0.10  <=0.20 x10^3/uL Final    IMMATURE GRANULOCYTE % 11/27/2019 0  0 - 1 % Final    The immature granulocyte fraction (IGF) quantifies total circulating myelocytes, metamyelocytes, and promyelocytes. It is used to evaluate immune responses to infection, inflammation, or other stimuli of the bone marrow. Caution is advised in interpreting test results in  neonates who normally have greater numbers of circulating immature blood cells.      IMMATURE GRANULOCYTE # 11/27/2019 <0.10  <0.10 x10^3/uL Final    SARS-CoV-2 11/27/2019 Not Detected  Not Detected Final    INTERPRETATION: NEGATIVE, 2019-nCoV/SARS-CoV-2 NOT detected     Negative results indicate SARS-CoV-2 RNA was not detected, however negative results do not preclude SARS-CoV-2 infection and should not be used as the sole basis for patient management decisions. Negative results must be combined with clinical observations, patient history, and epidemiological information. If COVID-19 is still suspected based on exposure history together with other clinical findings, re-testing should be considered.    HISTORY CHECK 11/28/2019 TEST COMPLETED   Final    BABY'S PATIENT NUMBER 11/28/2019 [id number]   Final    BABY'S BLOOD TYPE 11/28/2019 Baby blood type is A Positive   Final    FETAL SCREEN (  ROSETTE TEST) 11/28/2019 NEGATIVE   Final    ABO/RH(D) 11/28/2019 A NEGATIVE   Final    ANTIBODY SCREEN 11/28/2019 NEGATIVE   Final    UNIT NUMBER 11/28/2019 [id number]   Final    BLOOD COMPONENT TYPE 11/28/2019 Rh IMMUNE GLOBULIN   Final    UNIT DIVISION 11/28/2019 00   Final    UNIT DISPENSE STATUS 11/28/2019 ISSUED,FINAL   Final    TRANSFUSION STATUS 11/28/2019 OK TO TRANSFUSE   Final    WBC 11/29/2019 9.8  3.7 - 11.0 x10^3/uL Final    RBC 11/29/2019 3.82 (L)  3.85 - 5.22 x10^6/uL Final    HGB 11/29/2019 10.4 (L)  11.5 - 16.0 g/dL Final    HCT 38/75/6433 33.3 (L)  34.8 - 46.0 % Final    MCV 11/29/2019 87.2  78.0 - 100.0 fL Final    MCH 11/29/2019 27.2  26.0 - 32.0 pg Final    MCHC 11/29/2019 31.2  31.0 - 35.5 g/dL Final    RDW-CV 29/51/8841 14.0  11.5 - 15.5 % Final    PLATELETS 11/29/2019 250  150 - 400 x10^3/uL Final    MPV 11/29/2019 10.1  8.7 - 12.5 fL Final    NEUTROPHIL % 11/29/2019 70  % Final    LYMPHOCYTE % 11/29/2019 19  % Final    MONOCYTE % 11/29/2019 8  % Final    EOSINOPHIL % 11/29/2019 2  % Final    BASOPHIL %  11/29/2019 1  % Final    NEUTROPHIL # 11/29/2019 6.92  1.50 - 7.70 x10^3/uL Final    LYMPHOCYTE # 11/29/2019 1.84  1.00 - 4.80 x10^3/uL Final    MONOCYTE # 11/29/2019 0.74  0.20 - 1.10 x10^3/uL Final    EOSINOPHIL # 11/29/2019 0.19  <=0.50 x10^3/uL Final    BASOPHIL # 11/29/2019 <0.10  <=0.20 x10^3/uL Final    IMMATURE GRANULOCYTE % 11/29/2019 0  0 - 1 % Final    The immature granulocyte fraction (IGF) quantifies total circulating myelocytes, metamyelocytes, and promyelocytes. It is used to evaluate immune responses to infection, inflammation, or other stimuli of the bone marrow. Caution is advised in interpreting test results in neonates who normally have greater numbers of circulating immature blood cells.      IMMATURE GRANULOCYTE # 11/29/2019 <0.10  <0.10 x10^3/uL Final     Dyane Dustman, MED STUDENT     I was present 04/22/2022 when the student was taking history, performing the exam, and during any medical decision making activities. I personally verified the history, performed the exam, and medical decision making as edited in the student's note. I agree with the medical student's note.     Sherilyn Banker, MD

## 2022-04-23 ENCOUNTER — Encounter (HOSPITAL_COMMUNITY): Payer: Self-pay

## 2022-04-24 ENCOUNTER — Encounter (INDEPENDENT_AMBULATORY_CARE_PROVIDER_SITE_OTHER): Payer: 59

## 2022-04-27 ENCOUNTER — Encounter (HOSPITAL_COMMUNITY): Payer: Self-pay

## 2022-04-30 ENCOUNTER — Telehealth (INDEPENDENT_AMBULATORY_CARE_PROVIDER_SITE_OTHER): Payer: Self-pay | Admitting: GENERAL SURGERY

## 2022-04-30 NOTE — Telephone Encounter (Signed)
Patient called asking if she can get her CT ordered STAT due to them not being able to get her into Feb 1st and she also asked if she can be prescribed different antibiotics because she was very sick all day after taking the current antibiotic.

## 2022-05-02 ENCOUNTER — Other Ambulatory Visit: Payer: Self-pay

## 2022-05-02 ENCOUNTER — Emergency Department
Admission: EM | Admit: 2022-05-02 | Discharge: 2022-05-02 | Disposition: A | Discharge status: Home / Self Care | Payer: BC Managed Care – PPO | Attending: Student in an Organized Health Care Education/Training Program | Admitting: Student in an Organized Health Care Education/Training Program

## 2022-05-02 ENCOUNTER — Emergency Department (HOSPITAL_COMMUNITY): Payer: BC Managed Care – PPO

## 2022-05-02 DIAGNOSIS — F17290 Nicotine dependence, other tobacco product, uncomplicated: Secondary | ICD-10-CM | POA: Insufficient documentation

## 2022-05-02 DIAGNOSIS — L089 Local infection of the skin and subcutaneous tissue, unspecified: Secondary | ICD-10-CM

## 2022-05-02 LAB — COMPREHENSIVE METABOLIC PANEL, NON-FASTING
ALBUMIN: 3.8 g/dL (ref 3.5–5.0)
ALKALINE PHOSPHATASE: 92 U/L (ref 40–110)
ALT (SGPT): 74 U/L — ABNORMAL HIGH (ref 8–22)
ANION GAP: 7 mmol/L (ref 4–13)
AST (SGOT): 47 U/L — ABNORMAL HIGH (ref 8–45)
BILIRUBIN TOTAL: 0.5 mg/dL (ref 0.3–1.3)
BUN/CREA RATIO: 6 (ref 6–22)
BUN: 4 mg/dL — ABNORMAL LOW (ref 8–25)
CALCIUM: 9.1 mg/dL (ref 8.6–10.2)
CHLORIDE: 111 mmol/L (ref 96–111)
CO2 TOTAL: 24 mmol/L (ref 22–30)
CREATININE: 0.65 mg/dL (ref 0.60–1.05)
ESTIMATED GFR - FEMALE: >90 mL/min/BSA (ref >=60)
GLUCOSE: 103 mg/dL (ref 65–125)
POTASSIUM: 4.1 mmol/L (ref 3.5–5.1)
PROTEIN TOTAL: 7 g/dL (ref 6.4–8.3)
SODIUM: 142 mmol/L (ref 136–145)

## 2022-05-02 LAB — CBC WITH DIFF
BASOPHIL #: <0.1 10*3/uL (ref <=0.20)
BASOPHIL %: 0 %
EOSINOPHIL #: <0.1 10*3/uL (ref <=0.50)
EOSINOPHIL %: 0 %
HCT: 38.3 % (ref 34.8–46.0)
HGB: 12.1 g/dL (ref 11.5–16.0)
IMMATURE GRANULOCYTE #: 0.11 10*3/uL — ABNORMAL HIGH (ref <0.10)
IMMATURE GRANULOCYTE %: 2 % — ABNORMAL HIGH (ref 0.0–1.0)
LYMPHOCYTE #: 1.02 10*3/uL (ref 1.00–4.80)
LYMPHOCYTE %: 16 %
MCH: 27.4 pg (ref 26.0–32.0)
MCHC: 31.6 g/dL (ref 31.0–35.5)
MCV: 86.7 fL (ref 78.0–100.0)
MONOCYTE #: 0.41 10*3/uL (ref 0.20–1.10)
MONOCYTE %: 6 %
MPV: 9.7 fL (ref 8.7–12.5)
NEUTROPHIL #: 4.9 10*3/uL (ref 1.50–7.70)
NEUTROPHIL %: 76 %
PLATELETS: 304 10*3/uL (ref 150–400)
RBC: 4.42 10*6/uL (ref 3.85–5.22)
RDW-CV: 13.2 % (ref 11.5–15.5)
WBC: 6.5 10*3/uL (ref 3.7–11.0)

## 2022-05-02 LAB — HCG, PLASMA OR SERUM QUANTITATIVE, PREGNANCY: HCG QUANTITATIVE PREGNANCY: <2 m[IU]/mL (ref <5)

## 2022-05-02 MED ORDER — AMOXICILLIN 875 MG-POTASSIUM CLAVULANATE 125 MG TABLET
1.0000 | ORAL_TABLET | Freq: Two times a day (BID) | ORAL | Status: DC
Start: 2022-05-02 — End: 2022-05-02

## 2022-05-02 MED ORDER — AMOXICILLIN 875 MG-POTASSIUM CLAVULANATE 125 MG TABLET
1.0000 | ORAL_TABLET | Freq: Two times a day (BID) | ORAL | 0 refills | Status: DC
Start: 2022-05-03 — End: 2022-05-20

## 2022-05-02 MED ORDER — AMOXICILLIN 875 MG-POTASSIUM CLAVULANATE 125 MG TABLET
1.0000 | ORAL_TABLET | ORAL | Status: AC
Start: 2022-05-02 — End: 2022-05-02
  Administered 2022-05-02: 1 via ORAL
  Filled 2022-05-02: qty 1

## 2022-05-02 MED ORDER — IOPAMIDOL 300 MG IODINE/ML (61 %) INTRAVENOUS SOLUTION
100.0000 mL | INTRAVENOUS | Status: AC
Start: 2022-05-02 — End: 2022-05-02
  Administered 2022-05-02: 100 mL via INTRAVENOUS

## 2022-05-02 NOTE — ED Nurses Note (Addendum)
Dr. Benzel at bedside

## 2022-05-02 NOTE — ED Nurses Note (Addendum)
Patient discharged home with family.  AVS reviewed with patient/care giver.  A written copy of the AVS and discharge instructions was given to the patient/care giver.  Questions sufficiently answered as needed.  Patient/care giver encouraged to follow up with PCP as indicated.  In the event of an emergency, patient/care giver instructed to call 911 or go to the nearest emergency room.      Patient informed of medication sent to pharmacy by MD and educated on use. States correct understanding. All questions answered. Patient ambulatory out of ED with steady gait. NAD at time of d/c.

## 2022-05-02 NOTE — ED Nurses Note (Signed)
Patient provided with warm blanket. Updated on plan of care. Denies any additional needs at this time. NAD. VSS. Call light in reach.

## 2022-05-02 NOTE — ED Provider Notes (Signed)
[facility]  [facility]  Emergency Department     HISTORY OF PRESENT ILLNESS     Date:  05/02/2022  Patient's Name:  Shari Lawson  Date of Birth:  November 16, 1995    Patient with roughly one month of worsening bilateral inguinal growth and burning pain. Patient was seen on 12/22 by [facility], was referred to gen surg for concern of bilateral inguinal hernias. Patient got an [place] on 1/5 that showed soft tissue heterogenicity inconsistent with inguinal hernias, but concerning for possible hematoma(s). Patient was seen by Dr. Jeb Levering on 1/10, who suspected lymphadenitis vs ingrown hairs, and recommended a CT abdomen to r/o hernias that was scheduled for 1/31. Patient was prescribed doxy at that time, but only took one dose due to excessive nausea after taking the medication. Bilateral inguinal masses continued to grow in size with associated worsening burning pain, left side > right. Pain is strictly localized at the area of the masses and does not radiate. Patient has had some fatigue during this time, but denies any fevers, n/v, abdominal pain, changes in urinary or bowel habits, changes in PO intake.       History provided by:  Patient  Language interpreter used: No        Review of Systems     Review of Systems   Constitutional:  Negative for activity change, chills and diaphoresis.   HENT: Negative.     Eyes: Negative.    Respiratory: Negative.     Cardiovascular: Negative.  Negative for palpitations and leg swelling.   Gastrointestinal:  Negative for abdominal distention, abdominal pain, constipation, diarrhea, nausea and vomiting.   Endocrine: Negative.    Genitourinary:  Negative for decreased urine volume, difficulty urinating, dysuria, flank pain, frequency and vaginal pain.        Non-radiating burning pain strictly localized to site of inguinal masses    Musculoskeletal: Negative.    Skin:  Positive for color change and rash.        Red and purple skin growths at bilateral inguinal sites    Neurological: Negative.    Psychiatric/Behavioral: Negative.         Previous History     Past Medical History:  Past Medical History:   Diagnosis Date    Injury of index finger, right, sequela     Insufficient prenatal care     Laceration of right index finger with tendon involvement     NSVD (normal spontaneous vaginal delivery)        Past Surgical History:  Past Surgical History:   Procedure Laterality Date    Hx dental extraction Right        Social History:  Social History     Tobacco Use    Smoking status: Former     Packs/day: 0.50     Years: 3.00     Additional pack years: 0.00     Total pack years: 1.50     Types: Cigarettes     Quit date: 06/27/2019     Years since quitting: 2.8    Smokeless tobacco: Former     Quit date: 04/13/2016   Vaping Use    Vaping Use: Every day    Substances: Nicotine   Substance Use Topics    Alcohol use: Not Currently    Drug use: No     Social History     Substance and Sexual Activity   Drug Use No       Family History:  Family  History   Problem Relation Age of Onset    No Known Problems Mother     Hypertension (High Blood Pressure) Father     No Known Problems Sister     No Known Problems Brother        Medication History:  Current Outpatient Medications   Medication Sig    [START ON 05/03/2022] amoxicillin-pot clavulanate (AUGMENTIN) 875-125 mg Oral Tablet Take 1 Tablet by mouth Twice daily for 27 doses    doxycycline 100 mg Oral Tablet Take 1 Tablet (100 mg total) by mouth Twice daily for 14 days       Allergies:  No Known Allergies    Physical Exam     Vitals:    BP 121/63   Pulse 72   Temp 36.8 C (98.2 F)   Resp 18   LMP 04/09/2022 (Approximate)   SpO2 99%           Physical Exam  Constitutional:       Appearance: Normal appearance. She is normal weight.   Cardiovascular:      Rate and Rhythm: Normal rate and regular rhythm.      Pulses: Normal pulses.      Heart sounds: Normal heart sounds.   Pulmonary:      Effort: Pulmonary effort is normal.      Breath sounds:  Normal breath sounds.   Abdominal:      General: Abdomen is flat. Bowel sounds are normal. There is no distension.      Palpations: Abdomen is soft. There is mass.      Tenderness: There is abdominal tenderness.      Comments: Bilateral tender inguinal masses with mild ecchymosis, right mass > left in size   Right mass soft and mildly fluctuant, left side swollen but non-fluctuant    Genitourinary:     General: Normal vulva.   Musculoskeletal:         General: Normal range of motion.   Skin:     General: Skin is warm and dry.   Neurological:      Mental Status: She is alert and oriented to person, place, and time.   Psychiatric:         Mood and Affect: Mood normal.         Behavior: Behavior normal.         Thought Content: Thought content normal.         Judgment: Judgment normal.           Diagnostic Studies/Treatment     Medications:  Medications Administered in the ED   iopamidol (ISOVUE-300) 61% infusion (100 mL Intravenous Given 05/02/22 1917)   amoxicillin-clavulanate (AUGMENTIN) 875-125mg  per tablet (1 Tablet Oral Given 05/02/22 2056)       Discharge Medication List as of 05/02/2022  8:57 PM        START taking these medications    Details   amoxicillin-pot clavulanate (AUGMENTIN) 875-125 mg Oral Tablet Take 1 Tablet by mouth Twice daily for 27 doses, Disp-27 Tablet, R-0, E-Rx             Labs:    Results for orders placed or performed during the hospital encounter of 05/02/22   COMPREHENSIVE METABOLIC PANEL, NON-FASTING   Result Value Ref Range    SODIUM 142 136 - 145 mmol/L    POTASSIUM 4.1 3.5 - 5.1 mmol/L    CHLORIDE 111 96 - 111 mmol/L    CO2 TOTAL 24 22 - 30 mmol/L  ANION GAP 7 4 - 13 mmol/L    BUN 4 (L) 8 - 25 mg/dL    CREATININE 9.02 4.09 - 1.05 mg/dL    BUN/CREA RATIO 6 6 - 22    ALBUMIN 3.8 3.5 - 5.0 g/dL     CALCIUM 9.1 8.6 - 73.5 mg/dL    GLUCOSE 329 65 - 924 mg/dL    ALKALINE PHOSPHATASE 92 40 - 110 U/L    ALT (SGPT) 74 (H) 8 - 22 U/L    AST (SGOT)  47 (H) 8 - 45 U/L    BILIRUBIN TOTAL 0.5 0.3 -  1.3 mg/dL    PROTEIN TOTAL 7.0 6.4 - 8.3 g/dL    ESTIMATED GFR - FEMALE >90 >=60 mL/min/BSA   HCG, PLASMA OR SERUM QUANTITATIVE, PREGNANCY   Result Value Ref Range    HCG QUANTITATIVE PREGNANCY <2 <5 mIU/mL   CBC WITH DIFF   Result Value Ref Range    WBC 6.5 3.7 - 11.0 x10^3/uL    RBC 4.42 3.85 - 5.22 x10^6/uL    HGB 12.1 11.5 - 16.0 g/dL    HCT 26.8 34.1 - 96.2 %    MCV 86.7 78.0 - 100.0 fL    MCH 27.4 26.0 - 32.0 pg    MCHC 31.6 31.0 - 35.5 g/dL    RDW-CV 22.9 79.8 - 92.1 %    PLATELETS 304 150 - 400 x10^3/uL    MPV 9.7 8.7 - 12.5 fL    NEUTROPHIL % 76.0 %    LYMPHOCYTE % 16.0 %    MONOCYTE % 6.0 %    EOSINOPHIL % 0.0 %    BASOPHIL % 0.0 %    NEUTROPHIL # 4.90 1.50 - 7.70 x10^3/uL    LYMPHOCYTE # 1.02 1.00 - 4.80 x10^3/uL    MONOCYTE # 0.41 0.20 - 1.10 x10^3/uL    EOSINOPHIL # <0.10 <=0.50 x10^3/uL    BASOPHIL # <0.10 <=0.20 x10^3/uL    IMMATURE GRANULOCYTE % 2.0 (H) 0.0 - 1.0 %    IMMATURE GRANULOCYTE # 0.11 (H) <0.10 x10^3/uL       Radiology:  CT ABDOMEN PELVIS W IV CONTRAST    CT ABDOMEN PELVIS W IV CONTRAST   Final Result   1. Bilateral groin fluid collection which appears to extend down to the inguinal canal. There are some mild peripheral enhancement which is most conspicuous at the deep aspect. This could represent hydroceles of Canal of Nuck which may be secondarily infected. Please correlate with symptoms and labs.                         The CT exam was performed using one or more the following a dose reduction techniques: Automated exposure control, adjustment of the mA and/or kV according to the patient's size, or use of iterative reconstruction technique.         Radiologist location ID: [id number]             ECG:  NONE      Procedure     Procedures    Course/Disposition/Plan     Course:    Patient alert and oriented in bed in no acute distress when provider went to assess. Vitals stable. Patient stated she was having worsening burning pain and growth around the site of the inguinal masses, which  prompted her to come into the ED for earlier CT scan. CBC/CMP unremarkable for acute infection or anemia. Patient stated she had been eating, drinking,  voiding and stooling appropriately during the day. Pregnancy test negative, CT abdomen with contrast ordered. Results showed bilateral groin fluid collection which appears to extend down to the inguinal canal. Dr. Dortha Kern from general surgery was consulted, who reviewed the patient's history and images. She stated if patient was vitally stable, no emergent surgical intervention was warranted at this time and no samples should be taken until further assessment from Dr. Gerhard Perches could be obtained. Dr. Dortha Kern stated it was appropriate to discharge patient with antibiotics and a recommendation to be seen in the general surgery clinic within one to two weeks of ED discharge.  These recommendations were discussed with patient, who felt comfortable with the plan. Patient was given a dose of Augmentin in the ED, and patient's vitals remained stable during her ED admission.Patient was discharged with the remainder of the antibiotic prescription course sent to her pharmacy of choice.     Disposition:    Discharged    Condition at Disposition:   Stable      Follow up:   [name], [place] [address]  [phone]    Schedule an appointment as soon as possible for a visit in 2 weeks      Dionicia Abler, MD  [place]  [place] [address]  [phone]    Schedule an appointment as soon as possible for a visit in 2 weeks      [facility] Emergency Department  [address]  [place]  [phone]  Go to   If symptoms worsen      Clinical Impression:     Clinical Impression   Soft tissue infection (Primary)       Future Appointments Scheduled in Epic:  Future Appointments   Date Time Provider [place]   05/04/2022 11:30 AM Velora Mediate, PsyD [facility]   05/13/2022 11:30 AM [facility] 2 [facility]   06/10/2022  3:00 PM [name], MD [facility]       Click Refresh!! Then remove this statement and stars

## 2022-05-02 NOTE — ED Nurses Note (Signed)
Patient ambulatory to restroom with steady gait.

## 2022-05-02 NOTE — ED Triage Notes (Signed)
Pt arrives to ed with c/o right and left possible hernias (groin area).  Pt was to have a ct scan on the 31 of this month but, cannot wait due to increased pain. Pt has normal urination and bowel movements. Pt rates pain 2/10, increased pain with movement described as a burning sensations. Vitals stable in triage at this time.

## 2022-05-02 NOTE — Discharge Instructions (Addendum)
Pick up antibiotic from pharmacy, and take the medication as prescribed until medication course is completed.     Make an appointment with Dr. Gerhard Perches at the general surgery clinic for ED follow-up and review of CT results within 2 weeks of discharge.    Make an appointment with your Primary Care Provider for ED follow-up.     If symptoms worsen, seek further medical care.

## 2022-05-02 NOTE — ED Notes (Signed)
Called Dr. Dortha Kern, surgery, for consult with Dr. Berneice Gandy, currently speaking

## 2022-05-03 ENCOUNTER — Telehealth (HOSPITAL_COMMUNITY): Payer: Self-pay

## 2022-05-03 NOTE — Progress Notes (Signed)
Post Ed Follow-Up    Post ED Follow-Up:   Document completed and/or attempted interactive contact(s) after transition to home after emergency department stay.:   Transition Facility and relevant Date:   Discharge Date: 05/02/22  Discharge from [facility] Emergency Department?: Yes  Discharge Facility: [facility]  Contacted by: Corene Cornea, RN  Contact method: Patient/Caregiver Telephone  Contact completed: 05/03/2022 10:28 AM  Medications prescribed: Yes

## 2022-05-04 ENCOUNTER — Telehealth (INDEPENDENT_AMBULATORY_CARE_PROVIDER_SITE_OTHER): Payer: Self-pay | Admitting: PSYCHOLOGIST-CLINICAL

## 2022-05-04 ENCOUNTER — Ambulatory Visit (INDEPENDENT_AMBULATORY_CARE_PROVIDER_SITE_OTHER): Payer: Self-pay | Admitting: PSYCHOLOGIST-CLINICAL

## 2022-05-04 NOTE — Telephone Encounter (Signed)
LVM had to cancel appt [place] sick. Told patient to call back to get rescheduled.    [facility]

## 2022-05-07 ENCOUNTER — Telehealth (HOSPITAL_COMMUNITY): Payer: Self-pay | Admitting: Nurse Practitioner

## 2022-05-13 ENCOUNTER — Ambulatory Visit (HOSPITAL_COMMUNITY): Payer: Self-pay

## 2022-05-19 ENCOUNTER — Other Ambulatory Visit (INDEPENDENT_AMBULATORY_CARE_PROVIDER_SITE_OTHER): Payer: Self-pay

## 2022-05-19 DIAGNOSIS — F411 Generalized anxiety disorder: Secondary | ICD-10-CM

## 2022-05-19 DIAGNOSIS — F321 Major depressive disorder, single episode, moderate: Secondary | ICD-10-CM

## 2022-05-20 ENCOUNTER — Encounter (HOSPITAL_COMMUNITY): Payer: Self-pay | Admitting: GENERAL SURGERY

## 2022-05-20 ENCOUNTER — Encounter (INDEPENDENT_AMBULATORY_CARE_PROVIDER_SITE_OTHER): Payer: Self-pay | Admitting: GENERAL SURGERY

## 2022-05-20 ENCOUNTER — Other Ambulatory Visit: Payer: Self-pay

## 2022-05-20 ENCOUNTER — Ambulatory Visit: Payer: BC Managed Care – PPO | Attending: GENERAL SURGERY | Admitting: GENERAL SURGERY

## 2022-05-20 ENCOUNTER — Inpatient Hospital Stay (HOSPITAL_COMMUNITY)
Admission: RE | Admit: 2022-05-20 | Discharge: 2022-05-20 | Disposition: A | Discharge status: Home / Self Care | Payer: BC Managed Care – PPO | Source: Ambulatory Visit | Attending: GENERAL SURGERY | Admitting: GENERAL SURGERY

## 2022-05-20 ENCOUNTER — Encounter (HOSPITAL_COMMUNITY): Payer: Self-pay

## 2022-05-20 VITALS — BP 102/44 | HR 57 | Temp 96.9°F | Ht 61.0 in | Wt 113.8 lb

## 2022-05-20 DIAGNOSIS — L02214 Cutaneous abscess of groin: Secondary | ICD-10-CM | POA: Insufficient documentation

## 2022-05-20 NOTE — Progress Notes (Deleted)
GENERAL SURGERY, MEDICAL OFFICE BUILDING  [address]  [place] [address]  Operated by [facility]  History and Physical     Name: Shari Lawson MRN:  [id number]   Date: 05/20/2022 Age: 27 y.o.     Assessment Plan:  Skin subcutaneous abscesses especially of left groin and resolving on the right. No evidence of hernia, but I do not completely rule this out because CT shows possible fluid in Canal of Nuck bilaterally.   We will proceed with I&D this Friday with TIVA especially on the left side and possible on the right side. Patient's mother is an NP so she feels comfortable packing this afterwards.     Chief Complaint: ED Follow-up    History of Present Illness   Shari Lawson is a 27 y.o. year old female who comes to clinic for follow up.   Patient was seen in clinic on 01/10 for evaluation of bilateral inguinal masses. She was scheduled for CT and [place] for suspicion of lymphadenitis vs ingrown hairs and was placed on doxycycline, but could not tolerate this. She admits she went to the ED on 1/20 due to severe pain at the mass sites that she describes as burning. She had CT abdomen pelvis done then and started on amoxicillin at the time. CT showed bilateral groin fluid collection extending down to the inguinal canal. Since then, patient states the right mass popped and drained pus and blood, and her pain improved afterwards. She has tried warm compresses at home. She is G2P2. Patient is accompanied by her mother. She denies fevers, chills, history of DM, or anticoagulant use. During this encounter, medical student Shari Lawson and Shari Lawson, [place], were present.       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     No Known Allergies  Family Medical History:       Problem Relation (Age of Onset)    Hypertension (High Blood Pressure) Father    No Known Problems Mother, Sister, Brother            Social History     Tobacco Use    Smoking status: Former     Packs/day: 0.50     Years: 3.00     Additional pack years: 0.00     Total pack years: 1.50     Types: Cigarettes     Quit date: 06/27/2019     Years since quitting: 2.8    Smokeless tobacco: Former     Quit date: 04/13/2016   Substance Use Topics    Alcohol use: Not Currently      Review of Systems  Review of Systems - rest of 12 point review is negative except for what is noted under HPI    Examination:  BP (!) 102/44   Pulse 57   Temp 36.1 C (96.9  F) (Thermal Scan)   Ht 1.549 m (5\' 1" )   Wt 51.6 kg (113 lb 12.8 oz)   LMP 05/12/2022 (Approximate)   SpO2 99%   BMI 21.50 kg/m       Physical Exam:  Constitutional: in NAD, appears **  Eyes: conjunctiva appears clear  HENT: head appears normocephalic  Cardiovascular: Regular rate and rhythm  Respiratory: normal thoracic excursions  Gastrointestinal: appears without distension  Neurological: appears without gross deficits  Psychiatric: normal affect  Integument: appears without cyanosis/jaundice.   Musckluloskeletal: appears without acute deformity of extremities  Abdominal: 3 x 2.5 cm chronic abscess of left groin. Very small what appears to be resolved abscess of right groin. Do not feel any masses consistent with hernia on either side.     Data reviewed:  PROCEDURE DESCRIPTION: CT ABDOMEN PELVIS W IV CONTRAST     CLINICAL INDICATION: inguinal canal pain     COMPARISON: No prior studies were compared.        FINDINGS: No acute finding in the lung bases.     Gallbladder, liver, spleen, and pancreas are unremarkable.     Adrenals, kidneys,  and urinary bladder are unremarkable. Uterus is present. No adnexal mass.     No acute finding in the bowel. Aorta is normal caliber. Bilateral groin fluid collections. On the right measuring 4.7 x 2.6 x 3.7 cm and on the left 3.6 x 0.0 x 3.5 cm. There is some adjacent fat stranding. This extends down to the inguinal canal where there is some heterogeneous enhancement. The adjacent lymph nodes are not abnormally enlarged.     No acute osseous abnormality.     IMPRESSION:  1. Bilateral groin fluid collection which appears to extend down to the inguinal canal. There are some mild peripheral enhancement which is most conspicuous at the deep aspect. This could represent hydroceles of Canal of Nuck which may be secondarily infected. Please correlate with symptoms and labs.       US ABDOMINAL WALL (R/O HERNIATION) performed on 04/17/2022 4:34 PM.     REASON FOR EXAM:  K40.20: Bilateral inguinal hernia     FINDINGS:  Ultrasound images of the bilateral palpable areas were obtained. There is diffuse heterogenicity within the superficial soft tissues in the area of patient's palpable concern. Overall appearance is suggestive of edematous changes and/or hematoma. Appearance is consistent with inguinal hernias.     IMPRESSION:  1. Soft tissue heterogenicity in the palpable areas bilaterally concerning for hematoma as appearance is inconsistent with inguinal hernias. Further evaluation with cross-sectional imaging is recommended.    Assessment and Plan  Problem List Items Addressed This Visit    None      I am scribing for, and in the presence of, Shari Abler, MD, for services provided on 05/20/2022  Shari Lawson, [place]    // Shari Lawson, [place]  05/20/22 11:40

## 2022-05-20 NOTE — Progress Notes (Addendum)
[facility]  History and Physical      Shari Lawson, Shari Lawson   MRN:  [id number]  Date of Birth:  05-17-1995    Date of Service: 05/20/2022    Assessment Plan:  Skin subcutaneous abscesses especially of left groin and resolving on the right. No evidence of hernia, but I do not completely rule this out because CT shows possible fluid in Canal of Nuck bilaterally.   We will proceed with I&D this Friday with TIVA especially on the left side and possible on the right side. Patient's mother is an NP so she feels comfortable packing this afterwards.     Chief Complaint: Follow Up    HPI: Shari Lawson, Shari Lawson is a 27 y.o. year old female who presents today for or follow up.   Patient was seen in clinic on 01/10 for evaluation of bilateral inguinal masses. She was scheduled for CT and [place] for suspicion of lymphadenitis vs ingrown hairs and was placed on doxycycline, but could not tolerate this. She admits she went to the ED on 1/20 due to severe pain at the mass sites that she describes as burning. She had CT abdomen pelvis done then and started on amoxicillin at the time. CT showed bilateral groin fluid collection extending down to the inguinal canal. Since then, patient states the right mass popped and drained pus and blood, and her pain improved afterwards. She has tried warm compresses at home. She is G2P2. Patient is accompanied by her mother. She denies fevers, chills, history of DM, or anticoagulant use. During this encounter, medical student Ballard Russell and Harley Alto, [place], were present.         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             Past Surgical History:   Procedure Laterality Date    HX DENTAL EXTRACTION Right            ROS: besides what is noted in HPI, rest of 12 point systems is negative for acute symptoms.    Social History     Socioeconomic History    Marital status: Single     Spouse name: Not on file    Number of children: Not on file    Years of education: 12    Highest education level: Not on file   Occupational History    Not on file   Tobacco Use    Smoking status: Former     Packs/day: 0.50     Years: 3.00     Additional pack years: 0.00     Total pack years: 1.50     Types: Cigarettes     Quit date: 06/27/2019     Years since quitting: 2.8    Smokeless tobacco: Former     Quit date: 04/13/2016   Vaping Use    Vaping Use: Every day  Substances: Nicotine   Substance and Sexual Activity    Alcohol use: Not Currently    Drug use: No    Sexual activity: Not Currently   Other Topics Concern    Not on file   Social History Narrative    Not on file     Social Determinants of Health     Financial Resource Strain: Not on file   Transportation Needs: Not on file   Social Connections: Not on file   Intimate Partner Violence: Not on file   Housing Stability: Not on file       Vitals:    05/20/22 1113   BP: (!) 102/44   Pulse: 57   Temp: 36.1 C (96.9 F)   TempSrc: Thermal Scan   SpO2: 99%   Weight: 51.6 kg (113 lb 12.8 oz)   Height: 1.549 m (5' 1"$ )   BMI: 21.55           Physical Exam:  Eyes: clear conjunctiva  HENT: Airway patent, Trachea midline, No large goiters or neck lymphadenopathy  Cardiovascular: Regular rate and rhythm  Respiratory: normal thoracic excursions  Gastrointestinal: Soft, nondistended, no rebound  Neurological: no gross deficits  Psychiatric: normal affect  Integument: no jaundice  Musckluloskeletal: no acute deformity of extremities  Lymphatics: no cervical adenopathy  Abdominal: 3 x 2.5 cm chronic abscess of  left groin. Very small what appears to be resolved abscess of right groin. Do not feel any masses consistent with hernia on either side.     Data reviewed:  PROCEDURE DESCRIPTION: CT ABDOMEN PELVIS W IV CONTRAST     CLINICAL INDICATION: inguinal canal pain     COMPARISON: No prior studies were compared.        FINDINGS: No acute finding in the lung bases.     Gallbladder, liver, spleen, and pancreas are unremarkable.     Adrenals, kidneys, and urinary bladder are unremarkable. Uterus is present. No adnexal mass.     No acute finding in the bowel. Aorta is normal caliber. Bilateral groin fluid collections. On the right measuring 4.7 x 2.6 x 3.7 cm and on the left 3.6 x 0.0 x 3.5 cm. There is some adjacent fat stranding. This extends down to the inguinal canal where there is some heterogeneous enhancement. The adjacent lymph nodes are not abnormally enlarged.     No acute osseous abnormality.     IMPRESSION:  1. Bilateral groin fluid collection which appears to extend down to the inguinal canal. There are some mild peripheral enhancement which is most conspicuous at the deep aspect. This could represent hydroceles of Canal of Nuck which may be secondarily infected. Please correlate with symptoms and labs.       US ABDOMINAL WALL (R/O HERNIATION) performed on 04/17/2022 4:34 PM.     REASON FOR EXAM:  K40.20: Bilateral inguinal hernia     FINDINGS:  Ultrasound images of the bilateral palpable areas were obtained. There is diffuse heterogenicity within the superficial soft tissues in the area of patient's palpable concern. Overall appearance is suggestive of edematous changes and/or hematoma. Appearance is consistent with inguinal hernias.     IMPRESSION:  1. Soft tissue heterogenicity in the palpable areas bilaterally concerning for hematoma as appearance is inconsistent with inguinal hernias. Further evaluation with cross-sectional imaging is recommended.    No orders of the defined types were placed in this  encounter.    I am scribing for, and in the presence of, Dionicia Abler, MD, for services provided  on 05/20/2022  Lenard Simmer, SCRIBE    // Lenard Simmer, [place]  05/20/22 12:06    I personally performed the services described in this documentation, as scribed  in my presence, and it is both accurate  and complete.    Dionicia Abler, MD  Dionicia Abler, MD

## 2022-05-21 ENCOUNTER — Encounter (HOSPITAL_COMMUNITY): Payer: Self-pay | Admitting: GENERAL SURGERY

## 2022-05-21 NOTE — Anesthesia Preprocedure Evaluation (Signed)
ANESTHESIA PRE-OP EVALUATION  Planned Procedure: DRAINAGE ABSCESS  Review of Systems  Anesthesia Complications comment: Pt denies h/o prob with what anesth she's had.   Pt denies fh prob with anesth/md/m prob/lazy eye.        patient summary reviewed  nursing notes reviewed        Pulmonary   Pt denies h/o asthma, URI x2 months, COVID ever, snoring, OSA, or other lung prob.   When asked about snoring, pt reports "I don't think I do" and pt's step-mother reports that she has never heard pt snoring.    Pt reports she vapes daily, most recently 05/21/22 pm.    Pt denies h/o use of marijuana, marijuana products, or other drugs.,   Cardiovascular    Pt denies h/o HTN, HLD, or other heart prob/m/p/cp/mi.    Pt reports she goes up/down a flight of stairs at home and chases her son and daughter.   Pt reports she has not been out for a walk lately (it's been too cold).   Pt reports she vacuums/cleans at home but does not sweep or mop.   Pt denies CP/SOB/prob with physical exertion.   ,       GI/Hepatic/Renal    Other than h/o GERD (only with pregnancy), pt denies GI/stomach/intestine/gb/liver prob, hepatitis, or gastritis.    Pt denies kidney/urinary tract problems.    05/02/22:   K+ 4.1;  est'd GFR>90;   ALT/AST 74/47;            Endo/Other    Pt denies DM, GDM, or endo/thyroid/adrenal/pituitary prob.    05/02/22:     glucose 103;      Pt denies heme prob/anemia/thalassemia, bleeding problems, or h/o transfusion.    Per chart, pt is Rh negative;    05/02/22:  WBC 6.5;  H/H 12.1/38.3;  plt 304;    Current abscess at left groin.    Pt reports she also has a similar infection on the right side which has previously "popped" and drained.    Pt denies that she is currently breast feeding.   (Per pt and her step-mother, pt has not breast fed in approx a year.)      H/o right index finger injury/laceration with tendon involvement;  G2P2;  s/p NSVD with insufficient prenatal care;      ,      Neuro/Psych/MS    Other than h/o depression,  pt denies h/o sz, stroke/ministroke, or other CNS/neuro prob.       Pt reports she vapes daily, most recently 05/21/22 pm.    Pt denies h/o use of marijuana, marijuana products, or other drugs.    Pt reports last having consumed EtOH "months ago."  , depression     Cancer                        Physical Assessment      Airway       Mallampati: II    TM distance: 3 FB    Neck ROM: full  Mouth Opening: good.  No Facial hair  No Beard  No endotracheal tube present  No Tracheostomy present    Dental       Dentition intact             Pulmonary    Comment: Pt denies h/o asthma, URI x2 months, COVID ever, snoring, OSA, or other lung prob.   When asked about snoring, pt reports "I don't think I do"  and pt's step-mother reports that she has never heard pt snoring.    Pt reports she vapes daily, most recently 05/21/22 pm.    Pt denies h/o use of marijuana, marijuana products, or other drugs.  Breath sounds clear to auscultation  (-) no rhonchi, no decreased breath sounds, no wheezes, no rales and no stridor     Cardiovascular    Rhythm: regular  Rate: Normal  (-) no friction rub and no murmur     Other findings  Pt denies loose/broken teeth.   Pt denies dental work other than possibly a filling or some fillings.   Pt denies anything loose in her mouth.  Pt reports she had filling(s) when she was younger but is not sure if they were in baby teeth or permanent teeth.            Plan  ASA 2     Planned anesthesia type: general, TIVA with GA backup.     total intravenous anesthesia                    Intravenous induction       Anesthetic plan and risks discussed with patient  signed consent obtained      Use of blood products discussed with patient who consented to blood products.  Blood Products Consent Comment: Verbally.     Patient's NPO status is appropriate for Anesthesia.           (R/b discussed with patient incl but not limited to sore/dry throat, hoarseness, post-op N/V; cut lip/tongue/gum; dental damage; adverse/allergic  drug reaction; intra-op recall, post-op "patchy" memory, legally impaired for 24 hours post op; vascular injury, possible need to transfuse blood; major medical problem, heart prob/attack, aspiration with description, pneumonia, other lung prob, stroke, death.    All questions answered.   Pt reports she understands and agrees.  )

## 2022-05-22 ENCOUNTER — Ambulatory Visit (HOSPITAL_COMMUNITY): Payer: BC Managed Care – PPO | Admitting: Anesthesiology

## 2022-05-22 ENCOUNTER — Encounter (HOSPITAL_COMMUNITY): Payer: BC Managed Care – PPO | Admitting: GENERAL SURGERY

## 2022-05-22 ENCOUNTER — Ambulatory Visit (HOSPITAL_BASED_OUTPATIENT_CLINIC_OR_DEPARTMENT_OTHER): Payer: BC Managed Care – PPO

## 2022-05-22 ENCOUNTER — Inpatient Hospital Stay
Admission: RE | Admit: 2022-05-22 | Discharge: 2022-05-22 | Disposition: A | Discharge status: Home / Self Care | Payer: BC Managed Care – PPO | Attending: GENERAL SURGERY | Admitting: GENERAL SURGERY

## 2022-05-22 ENCOUNTER — Encounter (HOSPITAL_COMMUNITY)
Admission: RE | Disposition: A | Discharge status: Home / Self Care | Payer: Self-pay | Source: Home / Self Care | Attending: GENERAL SURGERY

## 2022-05-22 ENCOUNTER — Encounter (HOSPITAL_COMMUNITY): Payer: Self-pay | Admitting: GENERAL SURGERY

## 2022-05-22 DIAGNOSIS — M7989 Other specified soft tissue disorders: Secondary | ICD-10-CM | POA: Insufficient documentation

## 2022-05-22 DIAGNOSIS — Z0389 Encounter for observation for other suspected diseases and conditions ruled out: Secondary | ICD-10-CM

## 2022-05-22 DIAGNOSIS — L02214 Cutaneous abscess of groin: Secondary | ICD-10-CM | POA: Insufficient documentation

## 2022-05-22 DIAGNOSIS — L929 Granulomatous disorder of the skin and subcutaneous tissue, unspecified: Secondary | ICD-10-CM | POA: Insufficient documentation

## 2022-05-22 DIAGNOSIS — Z3200 Encounter for pregnancy test, result unknown: Secondary | ICD-10-CM | POA: Insufficient documentation

## 2022-05-22 LAB — HCG, URINE QUALITATIVE, PREGNANCY: HCG URINE QUALITATIVE: NEGATIVE

## 2022-05-22 SURGERY — DRAINAGE ABSCESS
Anesthesia: General | Site: Groin | Laterality: Bilateral | Wound class: Contaminated Wounds-Open, fresh, accidental wounds

## 2022-05-22 MED ORDER — SULFAMETHOXAZOLE 800 MG-TRIMETHOPRIM 160 MG TABLET
1.0000 | ORAL_TABLET | Freq: Two times a day (BID) | ORAL | 0 refills | Status: AC
Start: 2022-05-22 — End: 2022-06-01

## 2022-05-22 MED ORDER — SODIUM CHLORIDE 0.9 % INTRAVENOUS PIGGYBACK
1.0000 g | INJECTION | Freq: Once | INTRAVENOUS | Status: AC
Start: 2022-05-22 — End: 2022-05-22
  Administered 2022-05-22: 1 g via INTRAVENOUS

## 2022-05-22 MED ORDER — LACTATED RINGERS INTRAVENOUS SOLUTION
INTRAVENOUS | Status: DC
Start: 2022-05-22 — End: 2022-05-22
  Administered 2022-05-22: 0 mL via INTRAVENOUS

## 2022-05-22 MED ORDER — MIDAZOLAM 1 MG/ML INJECTION WRAPPER
INTRAMUSCULAR | Status: AC
Start: 2022-05-22 — End: 2022-05-22
  Filled 2022-05-22: qty 2

## 2022-05-22 MED ORDER — FENTANYL (PF) 50 MCG/ML INJECTION SOLUTION
Freq: Once | INTRAMUSCULAR | Status: DC | PRN
Start: 2022-05-22 — End: 2022-05-22
  Administered 2022-05-22 (×3): 12.5 ug via INTRAVENOUS

## 2022-05-22 MED ORDER — PROPOFOL 10 MG/ML IV - CHI
INTRAVENOUS | Status: DC | PRN
Start: 2022-05-22 — End: 2022-05-22
  Administered 2022-05-22: 150 ug/kg/min via INTRAVENOUS
  Administered 2022-05-22: 225 ug/kg/min via INTRAVENOUS
  Administered 2022-05-22: 200 ug/kg/min via INTRAVENOUS
  Administered 2022-05-22: 0 ug/kg/min via INTRAVENOUS
  Administered 2022-05-22: 175 ug/kg/min via INTRAVENOUS

## 2022-05-22 MED ORDER — BUPIVACAINE (PF) 0.5 % (5 MG/ML) INJECTION SOLUTION
INTRAMUSCULAR | Status: AC
Start: 2022-05-22 — End: 2022-05-22
  Filled 2022-05-22: qty 30

## 2022-05-22 MED ORDER — PROPOFOL 10 MG/ML IV BOLUS
INJECTION | Freq: Once | INTRAVENOUS | Status: DC | PRN
Start: 2022-05-22 — End: 2022-05-22
  Administered 2022-05-22: 10 mg via INTRAVENOUS
  Administered 2022-05-22: 50 mg via INTRAVENOUS
  Administered 2022-05-22 (×3): 10 mg via INTRAVENOUS
  Administered 2022-05-22: 20 mg via INTRAVENOUS
  Administered 2022-05-22 (×2): 10 mg via INTRAVENOUS
  Administered 2022-05-22: 20 mg via INTRAVENOUS
  Administered 2022-05-22: 10 mg via INTRAVENOUS
  Administered 2022-05-22: 20 mg via INTRAVENOUS
  Administered 2022-05-22: 10 mg via INTRAVENOUS

## 2022-05-22 MED ORDER — LIDOCAINE HCL 20 MG/ML (2 %) INJECTION SOLUTION
INTRAMUSCULAR | Status: AC
Start: 2022-05-22 — End: 2022-05-22
  Filled 2022-05-22: qty 20

## 2022-05-22 MED ORDER — LACTATED RINGERS INTRAVENOUS SOLUTION
INTRAVENOUS | Status: DC | PRN
Start: 2022-05-22 — End: 2022-05-22
  Administered 2022-05-22: 0 via INTRAVENOUS

## 2022-05-22 MED ORDER — PROPOFOL 10 MG/ML IV BOLUS
INJECTION | INTRAVENOUS | Status: AC
Start: 2022-05-22 — End: 2022-05-22
  Filled 2022-05-22: qty 50

## 2022-05-22 MED ORDER — DEXAMETHASONE SODIUM PHOSPHATE 4 MG/ML INJECTION SOLUTION
Freq: Once | INTRAMUSCULAR | Status: DC | PRN
Start: 2022-05-22 — End: 2022-05-22
  Administered 2022-05-22: 4 mg via INTRAVENOUS

## 2022-05-22 MED ORDER — ONDANSETRON HCL (PF) 4 MG/2 ML INJECTION SOLUTION
INTRAMUSCULAR | Status: AC
Start: 2022-05-22 — End: 2022-05-22
  Filled 2022-05-22: qty 2

## 2022-05-22 MED ORDER — HYDROCODONE 5 MG-ACETAMINOPHEN 325 MG TABLET
1.0000 | ORAL_TABLET | ORAL | 0 refills | Status: DC | PRN
Start: 2022-05-22 — End: 2022-06-03

## 2022-05-22 MED ORDER — CEFAZOLIN 1 GRAM SOLUTION FOR INJECTION
INTRAMUSCULAR | Status: AC
Start: 2022-05-22 — End: 2022-05-22
  Filled 2022-05-22: qty 10

## 2022-05-22 MED ORDER — FENTANYL (PF) 50 MCG/ML INJECTION SOLUTION
INTRAMUSCULAR | Status: AC
Start: 2022-05-22 — End: 2022-05-22
  Filled 2022-05-22: qty 2

## 2022-05-22 MED ORDER — PROPOFOL 10 MG/ML IV BOLUS
INJECTION | INTRAVENOUS | Status: AC
Start: 2022-05-22 — End: 2022-05-22
  Filled 2022-05-22: qty 20

## 2022-05-22 MED ORDER — DEXAMETHASONE SODIUM PHOSPHATE 4 MG/ML INJECTION SOLUTION
INTRAMUSCULAR | Status: AC
Start: 2022-05-22 — End: 2022-05-22
  Filled 2022-05-22: qty 1

## 2022-05-22 MED ORDER — LIDOCAINE HCL 20 MG/ML (2 %) INJECTION SOLUTION
Freq: Once | INTRAMUSCULAR | Status: DC | PRN
Start: 2022-05-22 — End: 2022-05-22
  Administered 2022-05-22: 7 mL via INTRAMUSCULAR

## 2022-05-22 MED ORDER — MIDAZOLAM 1 MG/ML INJECTION SOLUTION
Freq: Once | INTRAMUSCULAR | Status: DC | PRN
Start: 2022-05-22 — End: 2022-05-22
  Administered 2022-05-22 (×2): 1 mg via INTRAVENOUS

## 2022-05-22 MED ORDER — ONDANSETRON HCL (PF) 4 MG/2 ML INJECTION SOLUTION
Freq: Once | INTRAMUSCULAR | Status: DC | PRN
Start: 2022-05-22 — End: 2022-05-22
  Administered 2022-05-22: 4 mg via INTRAVENOUS

## 2022-05-22 MED ORDER — BUPIVACAINE (PF) 0.5 % (5 MG/ML) INJECTION SOLUTION
Freq: Once | INTRAMUSCULAR | Status: DC | PRN
Start: 2022-05-22 — End: 2022-05-22
  Administered 2022-05-22: 7 mL via INTRAMUSCULAR

## 2022-05-22 SURGICAL SUPPLY — 10 items
CONV USE 338451 - KIT LRG SET UP CUSTOM (CUSTOM TRAYS & PACK) ×1 IMPLANT
DRAPE UTIL TAPE ADH STRP 30X20IN XL PRXM LF  STRL DISP SURG SMS (DRAPE/PACKS/SHEETS/OR TOWEL) ×1 IMPLANT
ELECTRODE ESURG NEEDLE 2.8IN 3/32IN VLAB STRL SS 1.1IN DISP STD SHAFT LF (SURGICAL CUTTING SUPPLIES) ×1 IMPLANT
GOWN SURG LRG L4 BRTHBL STRL LF  DISP SMARTGOWN (DRAPE/PACKS/SHEETS/OR TOWEL) ×1 IMPLANT
HANDLE RIGID STRL LF  DISP DVN SURG LIGHT (MED SURG SUPPLIES) ×1 IMPLANT
HEMOSTAT ABS 3X2IN FLXB SRGCL_STRL (WOUND CARE SUPPLY) ×1 IMPLANT
KIT LRG SET UP CUSTOM (CUSTOM TRAYS & PACK) ×1
PENCIL SMOKEVAC COAT PSHBTN LF (MED SURG SUPPLIES) ×1 IMPLANT
SPONGE GAUZE 4X4IN MDCHC COTTON 12 PLY TY 7 LF  STRL DISP (WOUND CARE SUPPLY) ×1 IMPLANT
Universal Drape ×1 IMPLANT

## 2022-05-22 NOTE — H&P (Signed)
[facility]  History and Physical        Caddie, Torrence   MRN:  [id number]  Date of Birth:  [birth year]     Date of Service: 05/20/2022     Assessment Plan:  Skin subcutaneous abscesses especially of left groin and resolving on the right. No evidence of hernia, but I do not completely rule this out because CT shows possible fluid in Canal of Nuck bilaterally.   We will proceed with I&D this Friday with TIVA especially on the left side and possible on the right side. Patient's mother is an NP so she feels comfortable packing this afterwards.      Chief Complaint: Follow Up     HPI: Shari Lawson, Shari Lawson is a 27 y.o. year old female who presents today for or follow up.   Patient was seen in clinic on 01/10 for evaluation of bilateral inguinal masses. She was scheduled for CT and [place] for suspicion of lymphadenitis vs ingrown hairs and was placed on doxycycline, but could not tolerate this. She admits she went to the ED on 1/20 due to severe pain at the mass sites that she describes as burning. She had CT abdomen pelvis done then and started on amoxicillin at the time. CT showed bilateral groin fluid collection extending down to the inguinal canal. Since then, patient states the right mass popped and drained pus and blood, and her pain improved afterwards. She has tried warm compresses at home. She is G2P2. Patient is accompanied by her mother. She denies fevers, chills, history of DM, or anticoagulant use. During this encounter, medical student Ballard Russell and Harley Alto, [place], were present.                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                      Past Surgical History:   Procedure Laterality Date    HX DENTAL EXTRACTION Right                 ROS: besides what is noted in HPI, rest of 12 point systems is negative for acute symptoms.     Social History            Socioeconomic History    Marital status: Single       Spouse name: Not on file    Number of children: Not on file    Years of education: 12    Highest education level: Not on file   Occupational History    Not on file   Tobacco Use    Smoking status: Former       Packs/day: 0.50       Years: 3.00  Additional pack years: 0.00       Total pack years: 1.50       Types: Cigarettes       Quit date: 06/27/2019       Years since quitting: 2.8    Smokeless tobacco: Former       Quit date: 04/13/2016   Vaping Use    Vaping Use: Every day    Substances: Nicotine   Substance and Sexual Activity    Alcohol use: Not Currently    Drug use: No    Sexual activity: Not Currently   Other Topics Concern    Not on file   Social History Narrative    Not on file      Social Determinants of Health      Financial Resource Strain: Not on file   Transportation Needs: Not on file   Social Connections: Not on file   Intimate Partner Violence: Not on file   Housing Stability: Not on file             Vitals:     05/20/22 1113   BP: (!) 102/44   Pulse: 57   Temp: 36.1 C (96.9 F)   TempSrc: Thermal Scan   SpO2: 99%   Weight: 51.6 kg (113 lb 12.8 oz)   Height: 1.549 m (5' 1"$ )   BMI: 21.55               Physical Exam:  Eyes: clear conjunctiva  HENT: Airway patent, Trachea midline, No large goiters or neck lymphadenopathy  Cardiovascular: Regular rate and rhythm  Respiratory: normal thoracic excursions  Gastrointestinal: Soft, nondistended, no rebound  Neurological: no gross deficits  Psychiatric: normal affect  Integument: no jaundice  Musckluloskeletal: no acute deformity of  extremities  Lymphatics: no cervical adenopathy  Abdominal: 3 x 2.5 cm chronic abscess of left groin. Very small what appears to be resolved abscess of right groin. Do not feel any masses consistent with hernia on either side.      Data reviewed:  PROCEDURE DESCRIPTION: CT ABDOMEN PELVIS W IV CONTRAST     CLINICAL INDICATION: inguinal canal pain     COMPARISON: No prior studies were compared.        FINDINGS: No acute finding in the lung bases.     Gallbladder, liver, spleen, and pancreas are unremarkable.     Adrenals, kidneys, and urinary bladder are unremarkable. Uterus is present. No adnexal mass.     No acute finding in the bowel. Aorta is normal caliber. Bilateral groin fluid collections. On the right measuring 4.7 x 2.6 x 3.7 cm and on the left 3.6 x 0.0 x 3.5 cm. There is some adjacent fat stranding. This extends down to the inguinal canal where there is some heterogeneous enhancement. The adjacent lymph nodes are not abnormally enlarged.     No acute osseous abnormality.     IMPRESSION:  1. Bilateral groin fluid collection which appears to extend down to the inguinal canal. There are some mild peripheral enhancement which is most conspicuous at the deep aspect. This could represent hydroceles of Canal of Nuck which may be secondarily infected. Please correlate with symptoms and labs.         US ABDOMINAL WALL (R/O HERNIATION) performed on 04/17/2022 4:34 PM.     REASON FOR EXAM:  K40.20: Bilateral inguinal hernia     FINDINGS:  Ultrasound images of the bilateral palpable areas were obtained. There is diffuse heterogenicity within the superficial soft tissues in  the area of patient's palpable concern. Overall appearance is suggestive of edematous changes and/or hematoma. Appearance is consistent with inguinal hernias.     IMPRESSION:  1. Soft tissue heterogenicity in the palpable areas bilaterally concerning for hematoma as appearance is inconsistent with inguinal hernias. Further evaluation with  cross-sectional imaging is recommended.     No orders of the defined types were placed in this encounter.     I am scribing for, and in the presence of, Dionicia Abler, MD, for services provided on 05/20/2022  Lenard Simmer, SCRIBE     // Lenard Simmer, [place]  05/20/22 12:06     I personally performed the services described in this documentation, as scribed  in my presence, and it is both accurate  and complete.     Dionicia Abler, MD  Dionicia Abler, MD

## 2022-05-22 NOTE — OR PreOp (Signed)
Pt voided in pre op

## 2022-05-22 NOTE — Nurses Notes (Signed)
Patient discharged home with family.  AVS reviewed with patient/care giver.  A written copy of the AVS and discharge instructions was given to the patient/care giver.  Questions sufficiently answered as needed.  Patient/care giver encouraged to follow up with PCP as indicated.  In the event of an emergency, patient/care giver instructed to call 911 or go to the nearest emergency room.      AVS reviewed with patient and patient's mom at bedside. IV removed. Patient assisted with getting dressed. Patient assisted to the wheelchair and taken out to vehicle.

## 2022-05-22 NOTE — OR PreOp (Signed)
Dr. Kletter in to see patient.

## 2022-05-22 NOTE — Telephone Encounter (Signed)
No longer on patient's active medication list and appears she has stopped taking it.

## 2022-05-22 NOTE — Anesthesia Postprocedure Evaluation (Signed)
Anesthesia Post Op Evaluation    Patient: Shari Lawson  Procedure(s):  DRAINAGE ABSCESS    Last Vitals:Temperature: 36.2 C (97.1 F) (05/22/22 1231)  Heart Rate: 62 (05/22/22 1231)  BP (Non-Invasive): (!) 105/42 (05/22/22 1231)  Respiratory Rate: 16 (05/22/22 1231)  SpO2: 100 % (05/22/22 1231)    No notable events documented.    Patient is sufficiently recovered from the effects of anesthesia to participate in the evaluation and has returned to their pre-procedure level.  Patient location during evaluation: bedside       Patient participation: complete - patient participated  Level of consciousness: awake and alert and responsive to verbal stimuli    Pain score: 0  Pain management: adequate  Airway patency: patent    Anesthetic complications: no  Cardiovascular status: acceptable  Respiratory status: acceptable  Hydration status: acceptable  Patient post-procedure temperature: Pt Normothermic   PONV Status: Absent

## 2022-05-22 NOTE — OR PostOp (Signed)
Dr. Kletter at bedside

## 2022-05-22 NOTE — H&P (Signed)
[facility]      H&P UPDATE FORM                                                                                  Samala, Lafavor, 27 y.o. female  Date of Admission:  05/22/2022  Date of Birth:  1996/02/23    05/22/2022    STOP: IF H&P IS GREATER THAN 30 DAYS FROM SURGICAL DAY COMPLETE NEW H&P IS REQUIRED.     H & P updated the day of the procedure.  1.  H&P completed within 30 days of surgical procedure and has been reviewed within 24 hours of admission but prior to surgery or a procedure requiring anesthesia services, the patient has been examined, and no change has occured in the patients condition since the H&P was completed.       Change in medications: No        Patient's last menstrual period was 05/12/2022 (approximate).      Comments:     2.  Patient continues to be appropriate candidate for planned surgical procedure. YES    Dionicia Abler, MD

## 2022-05-22 NOTE — Anesthesia Transfer of Care (Signed)
ANESTHESIA TRANSFER OF CARE   Shari Lawson is a 27 y.o. ,female, Weight: 52.2 kg (115 lb)   had Procedure(s):  DRAINAGE ABSCESS  performed  05/22/22   Primary Service: Dionicia Abler, MD    Past Medical History:   Diagnosis Date    Injury of index finger, right, sequela     Insufficient prenatal care     Laceration of right index finger with tendon involvement     NSVD (normal spontaneous vaginal delivery)       Allergy History as of 05/22/22        No Known Allergies                  I completed my transfer of care / handoff to the receiving personnel during which we discussed:  Access, Airway, All key/critical aspects of case discussed, Analgesia, Antibiotics, Expectation of post procedure, Fluids/Product, Gave opportunity for questions and acknowledgement of understanding, Labs and PMHx      Post Location: Phase II                                                             Last OR Temp: Temperature: 36.2 C (97.1 F)  ABG:  POTASSIUM   Date Value Ref Range Status   05/02/2022 4.1 3.5 - 5.1 mmol/L Final     KETONES   Date Value Ref Range Status   11/24/2019 Negative Negative mg/dL Final     CALCIUM   Date Value Ref Range Status   05/02/2022 9.1 8.6 - 10.2 mg/dL Final     Comment:     Gadolinium-containing contrast can interfere with calcium measurement.     Airway:* No LDAs found *  Blood pressure (!) 105/42, pulse 62, temperature 36.2 C (97.1 F), resp. rate 16, height 1.549 m (5' 1"$ ), weight 52.2 kg (115 lb), last menstrual period 05/12/2022, SpO2 100%, not currently breastfeeding.

## 2022-05-22 NOTE — Brief Op Note (Signed)
[facility]  Brief Post-Operative Note    Patient Name: Shari Lawson, [facility] Number: [id number]  Encounter number: [id number]  Date of Service: 05/22/2022   Date of Birth: [birth year]      Pre-Operative Diagnosis: Skin abscess [L02.91] , bilateral groin    Post-Operative Diagnosis: Skin abscess [L02.91], bilateral groin    Procedure(s)/Description:  Procedure(s):  DRAINAGE ABSCESS LEFT AND RIGHT GROIN    Implants: none    Findings: Moderate purulence drained from left groin abscess, minimal fluid drained from right  groin abscess     Attending Surgeon: Dionicia Abler, MD    Assistant(s):    Pollyann Savoy, APRN  Ballard Russell, MED STUDENT    Anesthesia:  Anesthesiologist: Tressia Danas, MD IV Sedation TIVA    Drain: 1/4'' iodoform at both abscess sites     Estimated Blood Loss:  Minimal    Specimens Removed:   biopsy abscess wall of  left groin    Complications:  None          Patient is at increased risk for surgical bleeding : No    Patient is on postop antibiotic regimen > 24 hours:  No    Condition: stable    Disposition: Phase II    Ballard Russell, MED STUDENT  05/22/2022, 12:15    Dionicia Abler, MD

## 2022-05-23 NOTE — OR Surgeon (Signed)
[facility]  Operative Note     Patient Name: Shari Lawson, Shari Lawson [facility] Number: [id number]  Encounter number: [id number]  Date of Service: 05/22/2022   Date of Birth: [birth year]        Pre-Operative Diagnosis: Skin abscess [L02.91] , bilateral groin     Post-Operative Diagnosis: Skin abscess [L02.91], bilateral groin     Procedure(s)/Description:  Procedure(s):  DRAINAGE ABSCESS LEFT AND RIGHT GROIN     Implants: none     Findings: Moderate purulence drained from left groin abscess, minimal thin yellow fluid drained from right  groin abscess      Attending Surgeon: Dionicia Abler, MD     Assistant(s):    Pollyann Savoy, APRN  Ballard Russell, MED STUDENT     Anesthesia:  Anesthesiologist: Tressia Danas, MD IV Sedation TIVA     Drain: 1/4'' iodoform at both abscess sites      Estimated Blood Loss:  Minimal     Specimens Removed:   biopsy abscess wall of  left groin     Complications:  None          Patient is at increased risk for surgical bleeding : No     Patient is on postop antibiotic regimen > 24 hours:  No     Condition: stable     Disposition: Phase II    Technical description of procedure:    27 y/o female with bilateral skin/subcutaneous abscess of the groins. Left larger than right. Please  see H&P. Preoperative orders carried out. Kept supine, both sites and surrounding areas prepped with betadine. The left side was done first. Using a mixture of 0.5% marcaine and 2% lidocaine, the area was infiltrated. Then a #15 blade scalpel used to perform incision, entering the abscess cavity and decompressing a moderate amount of pururlent fluid. Wound C/S obtained. Biopsy of abscess wall carried out taking a small bit of subcutaneous tissue with pick-ups and scissors.  No loculations noted. H2O2 irrigation followed by thorough saline irrigation with clear return. Hemostasis assured with monopoloar and was excellent. 1/4" iodoform placed into interior, followed by dressings. Regarding the  right side, since it was hardly fluctuant, an 18 G needle was used to asssess the site. 1.5 cc yellow fluid aspirated and sent for C/S. A small incision was made after a new 25 needle was used to infiltrate the skin and a new #15 blade was uase to carry out the incision. Decompression done with hemostat. Irrigation done with saline and small amount of 1/4" iodoform placed followed by dressings. Tolerated procedure well and brought to phase II in stable condition. Sponge and needle counts were correct.    Malena Peer, APRN served as first Environmental consultant.  There was no qualified surgical resident available to perform the services of first assistant. Ms. Lolita Cram, APRN performed retraction, help with exposure and manipulation of tissue under my direction and placed dressings.    Dionicia Abler, MD

## 2022-05-26 DIAGNOSIS — L0291 Cutaneous abscess, unspecified: Secondary | ICD-10-CM

## 2022-05-26 LAB — STERILE SITE CULTURE AND GRAM STAIN, AEROBIC
FLC: NORMAL
FLC: NORMAL
GRAM STAIN: NONE SEEN

## 2022-05-26 LAB — SURGICAL PATHOLOGY SPECIMEN

## 2022-06-01 ENCOUNTER — Encounter (INDEPENDENT_AMBULATORY_CARE_PROVIDER_SITE_OTHER): Payer: Self-pay | Admitting: GENERAL SURGERY

## 2022-06-03 ENCOUNTER — Other Ambulatory Visit: Payer: Self-pay

## 2022-06-03 ENCOUNTER — Ambulatory Visit: Payer: BC Managed Care – PPO | Attending: GENERAL SURGERY | Admitting: GENERAL SURGERY

## 2022-06-03 ENCOUNTER — Encounter (INDEPENDENT_AMBULATORY_CARE_PROVIDER_SITE_OTHER): Payer: Self-pay | Admitting: GENERAL SURGERY

## 2022-06-03 VITALS — BP 108/66 | HR 65 | Temp 97.1°F | Ht 61.0 in | Wt 111.6 lb

## 2022-06-03 DIAGNOSIS — Z5189 Encounter for other specified aftercare: Secondary | ICD-10-CM

## 2022-06-03 NOTE — Progress Notes (Signed)
S/p  I&D bilateral groin abscesses. Has NC.  Here today for wound check.    During this encounter, Rodney Langton, MA was present    BP 108/66   Pulse 65   Temp 36.2 C (97.1 F) (Thermal Scan)   Ht 1.549 m (5' 1"$ )   Wt 50.6 kg (111 lb 9.6 oz)   LMP 05/12/2022 (Approximate)   SpO2 99%   BMI 21.09 kg/m     Both groin sites look good.  Healthy granulation tissue.    Surgically stable following I&D of bilateral groin abscesses.  Small amount of new packing was placed at each site.  We will follow up in 1 month.  Her mother who is a Designer, jewellery is doing the dressing changes.    Dionicia Abler, MD

## 2022-06-10 ENCOUNTER — Encounter (INDEPENDENT_AMBULATORY_CARE_PROVIDER_SITE_OTHER): Payer: Self-pay

## 2022-06-10 ENCOUNTER — Telehealth (INDEPENDENT_AMBULATORY_CARE_PROVIDER_SITE_OTHER): Payer: Self-pay

## 2022-06-10 ENCOUNTER — Ambulatory Visit (RURAL_HEALTH_CENTER): Payer: BC Managed Care – PPO

## 2022-06-10 ENCOUNTER — Other Ambulatory Visit: Payer: Self-pay

## 2022-06-10 VITALS — BP 110/62 | HR 64 | Temp 97.1°F | Resp 16 | Ht 62.4 in | Wt 118.8 lb

## 2022-06-10 DIAGNOSIS — F331 Major depressive disorder, recurrent, moderate: Secondary | ICD-10-CM

## 2022-06-10 DIAGNOSIS — F411 Generalized anxiety disorder: Secondary | ICD-10-CM

## 2022-06-10 MED ORDER — ESCITALOPRAM 5 MG TABLET
5.0000 mg | ORAL_TABLET | Freq: Every day | ORAL | 1 refills | Status: DC
Start: 2022-06-10 — End: 2023-04-21

## 2022-06-10 NOTE — Nursing Note (Signed)
06/10/22 1400   Gad-7 (Over the last 2 weeks, how often have you been bothered by the following problems?)   Feeling nervous,anxious,on edge 3   Not being able to stop or control worrying 2   Worrying too much about different things 3   Trouble relaxing 2   Being so restless that it is hard to sit still 2   Becoming easily annoyed or irritable 3   Feeling afraid as if something awful might happen 3   How difficult have these problems made it for you to work, take care of things at home, or get along with other people? Extremely difficult   Gad-7 Score   Gad-7 Score Total 18   Interpretation 15 or more, severe anxiety

## 2022-06-10 NOTE — Telephone Encounter (Signed)
LVM to schedule therapy as NPV at [facility] per referral from Nolon Stalls.    [facility]

## 2022-06-10 NOTE — Nursing Note (Signed)
Chief Complaint:   Chief Complaint              Anxiety     Depression           Functional Health Screen  Functional Health Screening:        BP 110/62   Pulse 64   Temp 36.2 C (97.1 F) (Thermal Scan)   Resp 16   Ht 1.585 m (5' 2.4")   Wt 53.9 kg (118 lb 12.8 oz)   LMP 06/03/2022 (Approximate)   BMI 21.45 kg/m       Social History     Tobacco Use   Smoking Status Former    Current packs/day: 0.00    Average packs/day: 0.5 packs/day for 3.0 years (1.5 ttl pk-yrs)    Types: Cigarettes    Start date: 06/26/2016    Quit date: 06/27/2019    Years since quitting: 2.9   Smokeless Tobacco Former    Quit date: 04/13/2016     Patient Health Rating           Depression Screening  PHQ Questionnaire  Little interest or pleasure in doing things.: More than half the days  Feeling down, depressed, or hopeless: More than half the days  PHQ 2 Total: 4  Trouble falling or staying asleep, or sleeping too much.: More than half the days  Feeling tired or having little energy: Nearly every day  Poor appetite or overeating: Nearly every day  Feeling bad about yourself/ that you are a failure in the past 2 weeks?: More than half the days  Trouble concentrating on things in the past 2 weeks?: More than half the days  Moving/Speaking slowly or being fidgety or restless  in the past 2 weeks?: More than half the days  Thoughts that you would be better off DEAD, or of hurting yourself in some way.: Nearly every day  PHQ 9 Total: 21  Interpretation of Total Score: 20-27 Severe depression  Allergies:  No Known Allergies  Medication History  Reviewed for OTC medication and any new medications, provider will review medication history  Results through Enter/Edit  No results found for this or any previous visit (from the past 24 hour(s)).  POCT Results  Care Team  Patient Care Team:  [name], MD as PCP - General (FAMILY MEDICINE)  Serita Kyle, [place]  Dollene Primrose, [place]  Immunizations - last 24 hours       Date Immunization Status  Dose Route/Site Given by    01/27/17 0000 Influenza Vaccine, 6 month-adult Given 0.5 mL            Dollene Primrose, CMA  06/10/2022, 14:52

## 2022-06-10 NOTE — Progress Notes (Signed)
[place], [place]  [address]  [place] [address]  Dept: [phone]  Loc: [phone]  Loc Fax: [phone]   Name: Shari Lawson MRN:  [id number]   Date: 06/10/2022 DOB: [birth year]                         Chief complaint:   Chief Complaint   Patient presents with    Anxiety    Depression       Subjective:  History of Presenting Illness: Shari Lawson is a 27 y.o. female who presents to clinic today with cc of worsening anxiety and depression. Shari Lawson started sertraline 25 mg daily after our previous visit in November 2023 and started to notice and improvement in her symptoms. After approximately 4 weeks on the medication she began to have sudden feelings of rage, not directed at anything in particular. She stopped taking sertraline and these feelings stopped. Since stopping sertraline, her mood has worsened, especially feelings of anxiety.       PHQ Questionnaire  Little interest or pleasure in doing things.: More than half the days  Feeling down, depressed, or hopeless: More than half the days  PHQ 2 Total: 4  Trouble falling or staying asleep, or sleeping too much.: More than half the days  Feeling tired or having little energy: Nearly every day  Poor appetite or overeating: Nearly every day  Feeling bad about yourself/ that you are a failure in the past 2 weeks?: More than half the days  Trouble concentrating on things in the past 2 weeks?: More than half the days  Moving/Speaking slowly or being fidgety or restless  in the past 2 weeks?: More than half the days  Thoughts that you would be better off DEAD, or of hurting yourself in some way.: Nearly every day  If you checked off any problems, how difficult have these problems made it for you to do your work, take care of things at home, or get along with other people?: Somewhat difficult  PHQ 9 Total: 21  Interpretation of Total Score: 20-27 Severe  depression         02/16/2022     3:00 PM 06/10/2022     2:00 PM   GAD-7 Questionnaire   Feeling nervous,anxious,on edge 3 3   Not being able to stop or control worrying 1 2   Worrying too much about different things 3 3   Trouble relaxing 0 2   Being so restless that it is hard to sit still 0 2   Becoming easily annoyed or irritable 3 3   Feeling afraid as if something awful might happen 1 3   How difficult have these problems made it for you to work, take care of things at home, or get along with other people? Extremely difficult Extremely difficult   Gad-7 Score Total 11 18   Interpretation 10-14, moderate anxiety 15 or more, severe anxiety       Outpatient Medications Marked as Taking for the 06/10/22 encounter (Office Visit) with [name], MD   Medication Sig    escitalopram oxalate (LEXAPRO) 5 mg Oral Tablet Take 1 Tablet (5 mg total) by mouth Once a day Indications: anxiousness associated with depression       Objective    Physical Exam:    Vitals:   BP 110/62   Pulse 64   Temp 36.2 C (97.1  F) (Thermal Scan)   Resp 16   Ht 1.585 m (5' 2.4")   Wt 53.9 kg (118 lb 12.8 oz)   LMP 06/03/2022 (Approximate)   BMI 21.45 kg/m     General:  patient is pleasant and in no acute distress;   HENT:  Head:  normocephalic; atraumatic   Eyes: PER; EOM intact   Nose:  no erythema    Throat:  moist mucous membranes  Neck: neck supple; trachea midline  Cardiovascular: well perfused  Respiratory:  easy respiratory effort  Abdomen: soft; non-distended;   Extremities: no edema or cyanosis bilaterally  Musculoskeletal: grossly normal  Skin: warm, dry, normal turgor;   Neurologic: alert and oriented x3; grossly normal   Psychiatric:  very pleasant and talkative; appears in good hygiene and appropriate mood        Assessment/Plan      Shari Lawson is a 27 y.o. female who presents to clinic today with cc of worsening anxiety and depression with side effects of sertraline. She is open to trying another medication  and would like to see behavioral health at [facility].         1. GAD (generalized anxiety disorder) (Primary)  2. Moderate episode of recurrent major depressive disorder (CMS [facility])  Referral placed again to behavioral health. Will trial escitalopram.   -     Refer to [employer]; Future  -     escitalopram oxalate (LEXAPRO) 5 mg Oral Tablet; Take 1 Tablet (5 mg total) by mouth Once a day Indications: anxiousness associated with depression      ENCOUNTER DIAGNOSES     ICD-10-CM   1. GAD (generalized anxiety disorder)  F41.1   2. Moderate episode of recurrent major depressive disorder (CMS HCC)  F33.1       Return to clinic: Return in about 2 months (around 08/09/2022) for pap smear.      Patient seen and discussed with attending physician, Dr. Leonides Schanz    [name], MD 06/10/2022, 14:59  PGY - [place]        I independently saw and examined the patient.  We discussed the management of hier anxiety and depression at the time of the visit.  I agree with the assessment and plan.    Primus Bravo, MD 06/11/2022, 13:49

## 2022-07-07 ENCOUNTER — Ambulatory Visit (INDEPENDENT_AMBULATORY_CARE_PROVIDER_SITE_OTHER): Payer: Self-pay | Admitting: PSYCHOLOGIST-CLINICAL

## 2022-07-20 ENCOUNTER — Ambulatory Visit (INDEPENDENT_AMBULATORY_CARE_PROVIDER_SITE_OTHER): Payer: Medicaid Other | Admitting: PSYCHOLOGIST-CLINICAL

## 2022-07-30 ENCOUNTER — Ambulatory Visit (INDEPENDENT_AMBULATORY_CARE_PROVIDER_SITE_OTHER): Payer: Medicaid Other | Admitting: PSYCHOLOGIST-CLINICAL

## 2022-08-06 ENCOUNTER — Ambulatory Visit (INDEPENDENT_AMBULATORY_CARE_PROVIDER_SITE_OTHER): Payer: Medicaid Other | Admitting: PSYCHOLOGIST-CLINICAL

## 2022-08-12 ENCOUNTER — Other Ambulatory Visit: Payer: Self-pay

## 2022-08-12 ENCOUNTER — Other Ambulatory Visit: Payer: Medicaid Other | Attending: Family Medicine

## 2022-08-12 ENCOUNTER — Ambulatory Visit (RURAL_HEALTH_CENTER): Payer: Medicaid Other | Admitting: FAMILY MEDICINE

## 2022-08-12 ENCOUNTER — Encounter (INDEPENDENT_AMBULATORY_CARE_PROVIDER_SITE_OTHER): Payer: Self-pay | Admitting: FAMILY MEDICINE

## 2022-08-12 VITALS — BP 108/51 | HR 60 | Temp 98.6°F | Resp 20 | Ht 61.0 in | Wt 116.0 lb

## 2022-08-12 DIAGNOSIS — Z13220 Encounter for screening for lipoid disorders: Secondary | ICD-10-CM

## 2022-08-12 DIAGNOSIS — Z0000 Encounter for general adult medical examination without abnormal findings: Secondary | ICD-10-CM

## 2022-08-12 DIAGNOSIS — Z01411 Encounter for gynecological examination (general) (routine) with abnormal findings: Secondary | ICD-10-CM

## 2022-08-12 DIAGNOSIS — Z23 Encounter for immunization: Secondary | ICD-10-CM

## 2022-08-12 DIAGNOSIS — Z124 Encounter for screening for malignant neoplasm of cervix: Secondary | ICD-10-CM | POA: Insufficient documentation

## 2022-08-12 DIAGNOSIS — Z113 Encounter for screening for infections with a predominantly sexual mode of transmission: Secondary | ICD-10-CM

## 2022-08-12 DIAGNOSIS — L918 Other hypertrophic disorders of the skin: Secondary | ICD-10-CM

## 2022-08-12 MED ORDER — LIDOCAINE 1 %-EPINEPHRINE 1:100,000 INJECTION SOLUTION
15.0000 mL | INTRAMUSCULAR | Status: AC
Start: 2022-08-12 — End: 2022-08-12
  Administered 2022-08-12: 150 mg via INTRADERMAL

## 2022-08-12 NOTE — Procedures (Signed)
FAMILY MEDICINE, [facility] [place] [facility]  [address]  [place] [address]    Procedure Note    Name: Shari Lawson MRN:  [id number]   Date: 08/12/2022 DOB:  03-08-1996 (27 y.o.)         REMOVAL OF SKIN TAGS; UP TO 45 (AMB ONLY)    Performed by: Debbrah Alar, MD  Authorized by: Mearl Latin, MD    Procedure Details:      Number of skin tags removed:  2       Documentation:  S: The patient complains of symptomatic skin tags located in the right axilla and right inframammary crease. These are irritated by clothing, jewelry and rubbing.    O: Patient appears well. Several benign skin tags are noted on the trunk, as well as the right axilla and inframammary creases.     A: Skin tags     P:  Areas were numbed using 1% lidocaine with epinephrine.  Skin tags are snipped off using ChloraPrep for cleansing and sterile iris scissors. These pathognomonic lesions are not sent for pathology.       Debbrah Alar, MD    ____________________________________________________________________    I was present throughout the procedure. No complications.     Mearl Latin, MD 09/03/2022 10:05

## 2022-08-12 NOTE — H&P (Signed)
FAMILY MEDICINE, [facility]  [address]  [place] [address]      Name: Shari Lawson MRN:  [id number]   Date: 08/12/2022 DOB:  06-14-1995 (27 y.o.)         WELL WOMAN VISIT    08/12/2022    Shari Lawson is a 27 y.o. who presents for her annual exam.    Today:  Patient states that she is doing well. No acute medical concerns.     Patient does note 2 skin tags that often get caught on her clothing or wrapped by her oral which then become irritated and painful.     Menstrual History  Last menstrual period:  Patient's last menstrual period was 08/03/2022 (approximate).   Are periods regular: yes  Any problems with periods: no  Current contraception: None; not desiring pregnancy.   Menopausal symptoms: no  G2P2002     Breast Health Screening  Breast concerns or complaints?  no      Genitourinary Screening  Pelvic pressure or fullness?  no  Urinary complaints or concerns?  no  Pelvic prolapse symptoms?  no  Fecal incontinence or concerns?  no  Dyspareunia or sexual concerns?  no     Depression Screening (PHQ-2)  In the past 2 weeks, have you:   Felt little interest or pleasure in doing things? no   Felt down, depressed, or hopeless? no     PMH:   Patient Active Problem List    Diagnosis    Moderate episode of recurrent major depressive disorder (CMS HCC)    RhD negative    Anxiety disorder        MEDICATIONS:  Outpatient Medications Marked as Taking for the 08/12/22 encounter (Office Visit) with Debbrah Alar, MD   Medication Sig    escitalopram oxalate (LEXAPRO) 5 mg Oral Tablet Take 1 Tablet (5 mg total) by mouth Once a day Indications: anxiousness associated with depression       ALLERGIES:  Patient has no known allergies.       Health Screenings:  Last pap: unknown  Last mammogram: Never   Previous biopsy or surgery: no   Doing self breast exams: no  Last colonoscopy: Never    Immunizations up to date: UTD   Immunization History   Administered Date(s)  Administered    AFLURIA-Influenza Vaccine (6-73mo)(Admin) 01/27/2017    DIPTH,PERTUSSIS-ACEL,TETANUS >10 YRS OLD 09/26/2019    Diptheria/Tetanus/Pertussis 6wk to <66yrs 08/14/1996, 10/24/1996, 02/06/1997, 10/01/1997, 10/09/2003    Flumist (Nasal) Influenza Vaccine, 2-49 yrs 01/18/2012    HPV VACCINE 05/31/2008    Hib Family 08/14/1996, 10/24/1996, 02/06/1997, 10/01/1997    Hpv Family 10/25/2007, 01/24/2008, 05/31/2008    Influenza Vaccine, 6 month-adult 12/23/2010, 01/27/2017    MEASLES/MUMPS/RUBELLA VIRUS VACCINE 06/26/1997, 02/08/2001, 11/29/2019    Mening Family 10/25/2007    Meningococcal Vaccine 04/25/2013    Polio Family 11-02-1996, 08/14/1996, 10/24/1996, 02/06/1997, 10/23/2005, 02/08/2006, 10/25/2007    RHOGAM 02/24/2017, 11/29/2019    Tetanus Toxoid/Diphtheria Toxoid/Acellular Pertussis Vaccine, Adsorbed 12/09/2016    Tetanus/diptheria-td-adult(admin) 07/02/2008    VARICELLA VACCINE LIVE 06/26/1997, 10/25/2007       FH:  Family History   Problem Relation Age of Onset    No Known Problems Mother     Hypertension (High Blood Pressure) Father     No Known Problems Sister     No Known Problems Brother          SH:  Sexuality  The patient is sexually  active with 1 partner(s).  Her partner(s) is female  Current contraception:  None    Diet/Exercise:  Diet:    Tries to eat at least 1 meal a day. Binges during menstruation. Usually eats dinner which is a larger meal. She does not have an appetite much of the time. Will occasionally snack.   No GERD, abdominal pain, early satiety, weight loss.     Exercise:    No formal routine. Very active with her 2 kids outside/pool/walk.     Substance use:  Social History     Tobacco Use    Smoking status: Former     Current packs/day: 0.00     Average packs/day: 0.5 packs/day for 3.0 years (1.5 ttl pk-yrs)     Types: Cigarettes     Start date: 06/26/2016     Quit date: 06/27/2019     Years since quitting: 3.1    Smokeless tobacco: Former     Quit date: 04/13/2016   Vaping Use     Vaping status: Every Day    Substances: Nicotine   Substance Use Topics    Alcohol use: Not Currently    Drug use: Yes     Frequency: 7.0 times per week     Types: Marijuana       Safety:  Domestic violence:  No concerns  Wears seatbelt: Yes       REVIEW OF SYSTEMS:  See HPI      Physical Examination:  VS: BP (!) 108/51   Pulse 60   Temp 37 C (98.6 F) (Tympanic)   Resp 20   Ht 1.549 m (5\' 1" )   Wt 52.6 kg (116 lb)   LMP 08/03/2022 (Approximate)   SpO2 97%   BMI 21.92 kg/m     Physical Exam  Vitals reviewed.   Constitutional:       General: She is not in acute distress.     Appearance: Normal appearance. She is normal weight. She is not ill-appearing.   HENT:      Head: Normocephalic and atraumatic.      Nose: Nose normal. No congestion.      Mouth/Throat:      Mouth: Mucous membranes are moist.      Pharynx: Oropharynx is clear.   Eyes:      Extraocular Movements: Extraocular movements intact.      Conjunctiva/sclera: Conjunctivae normal.   Cardiovascular:      Rate and Rhythm: Normal rate and regular rhythm.      Pulses: Normal pulses.      Heart sounds: No murmur heard.  Pulmonary:      Effort: Pulmonary effort is normal.      Breath sounds: Normal breath sounds. No wheezing.   Abdominal:      General: Abdomen is flat. Bowel sounds are normal.      Palpations: Abdomen is soft. There is no mass.      Tenderness: There is no abdominal tenderness.   Genitourinary:     General: Normal vulva.      Vagina: No vaginal discharge.   Musculoskeletal:         General: No swelling or tenderness. Normal range of motion.      Cervical back: Normal range of motion and neck supple.   Lymphadenopathy:      Cervical: No cervical adenopathy.   Skin:     General: Skin is warm and dry.      Capillary Refill: Capillary refill takes less than 2 seconds.  Findings: No bruising or rash.      Comments: Skin tags:  Located right axilla, and right inframammary crease   Neurological:      General: No focal deficit present.       Mental Status: She is alert and oriented to person, place, and time.   Psychiatric:         Mood and Affect: Mood normal.         Behavior: Behavior normal.         ASSESSMENT / PLAN:    27 y.o. female here for well woman exam:    *Pap smear + STD screening - completed today  *Immunizations  - up-to-date.  *Diet/exercise improvements/goals discussed.     * lipid screening ordered due to history of tobacco smoking.  * A1c screening not ordered today as patient is low risk.    *Tobacco/Alcohol/Drug use - recommended cessation from vaping.  Discussed that nicotine replacement therapy is available for this.  Patient expressed interest but ultimately is not ready to make this change.  We will continue to follow up.    *Depression screen - negative    *Contraception - none; patient is uninterested in hormonal birth control.  We discussed use of a diaphragm and spermicide or other barrier method such as condoms.          ICD-10-CM    1. Annual physical exam  Z00.00       2. Need for vaccination  Z23       3. Pap smear for cervical cancer screening  Z12.4 CYTOPATHOLOGY, GYN +/- HIGH RISK HPV      4. Screening for lipid disorders  Z13.220 LIPID PANEL      5. Cutaneous skin tags  L91.8 lidocaine 1%-EPINEPHrine 1:100,000 injection     REMOVAL OF SKIN TAGS; UP TO 45 (AMB ONLY)      6. Routine screening for STI (sexually transmitted infection)  Z11.3 CHLAMYDIA TRACHOMATIS/NEISSERIA GONORRHOEAE RNA, NAAT            Patient discussed and evaluated with attending physician, Dr. Merlinda Frederick.    / Debbrah Alar, MD 08/12/2022 13:46    PGY-3 [facility]    ____________________________________________________________________    I saw and examined the patient and discussed management with the resident. I reviewed the resident's note and agree with the documented findings and plan of care except as noted below:    Mearl Latin, MD 09/03/2022 10:05

## 2022-08-12 NOTE — Nursing Note (Signed)
Chief Complaint:   Chief Complaint              Medication Check     Annual Pap           Functional Health Screen  Functional Health Screening:        BP (!) 108/51   Pulse 60   Temp 37 C (98.6 F) (Tympanic)   Resp 20   Ht 1.549 m (5\' 1" )   Wt 52.6 kg (116 lb)   LMP 08/03/2022 (Approximate)   SpO2 97%   BMI 21.92 kg/m       Social History     Tobacco Use   Smoking Status Former    Current packs/day: 0.00    Average packs/day: 0.5 packs/day for 3.0 years (1.5 ttl pk-yrs)    Types: Cigarettes    Start date: 06/26/2016    Quit date: 06/27/2019    Years since quitting: 3.1   Smokeless Tobacco Former    Quit date: 04/13/2016     Patient Health Rating           Depression Screening  PHQ Questionnaire  Little interest or pleasure in doing things.: Not at all  Feeling down, depressed, or hopeless: Not at all  PHQ 2 Total: 0  Trouble falling or staying asleep, or sleeping too much.: Not at all  Feeling tired or having little energy: Not at all  Poor appetite or overeating: Nearly every day (poor appetite)  Feeling bad about yourself/ that you are a failure in the past 2 weeks?: Not at all  Trouble concentrating on things in the past 2 weeks?: Not at all  Moving/Speaking slowly or being fidgety or restless  in the past 2 weeks?: Not at all  Thoughts that you would be better off DEAD, or of hurting yourself in some way.: Not at all  PHQ 9 Total: 3  Interpretation of Total Score: 0-4 No depression  Allergies:  No Known Allergies  Medication History  Reviewed for OTC medication and any new medications, provider will review medication history  Results through Enter/Edit  No results found for this or any previous visit (from the past 24 hour(s)).  POCT Results  Care Team  Patient Care Team:  Delsa Sale, MD as PCP - General (FAMILY MEDICINE)  Roxy [place], [place]  Gilford Rile, CMA  Immunizations - last 24 hours       None          Aldean Jewett, [place]  08/12/2022, 11:26

## 2022-08-14 LAB — CHLAMYDIA TRACHOMATIS/NEISSERIA GONORRHOEAE RNA, NAAT
CHLAMYDIA TRACHOMATIS RNA: NEGATIVE
NEISSERIA GONORRHEA GC RNA: NEGATIVE

## 2022-08-14 NOTE — Result Encounter Note (Signed)
Negative sexually transmitted infection testing.

## 2022-08-18 LAB — CYTOPATHOLOGY, GYN +/- HIGH RISK HPV

## 2022-08-18 NOTE — Result Encounter Note (Signed)
Pap smear result normal. Repeat in 3 years.    Debbrah Alar, MD  08/18/2022, 14:56

## 2023-04-21 ENCOUNTER — Encounter (INDEPENDENT_AMBULATORY_CARE_PROVIDER_SITE_OTHER): Payer: Self-pay

## 2023-04-21 ENCOUNTER — Ambulatory Visit (RURAL_HEALTH_CENTER): Payer: 59

## 2023-04-21 ENCOUNTER — Other Ambulatory Visit: Payer: Self-pay

## 2023-04-21 VITALS — BP 115/69 | HR 71 | Temp 98.6°F | Resp 14 | Ht 62.0 in | Wt 113.2 lb

## 2023-04-21 DIAGNOSIS — N926 Irregular menstruation, unspecified: Secondary | ICD-10-CM

## 2023-04-21 DIAGNOSIS — R109 Unspecified abdominal pain: Secondary | ICD-10-CM

## 2023-04-21 DIAGNOSIS — F331 Major depressive disorder, recurrent, moderate: Secondary | ICD-10-CM

## 2023-04-21 DIAGNOSIS — F411 Generalized anxiety disorder: Secondary | ICD-10-CM

## 2023-04-21 MED ORDER — ESCITALOPRAM 20 MG TABLET
20.0000 mg | ORAL_TABLET | Freq: Every day | ORAL | 3 refills | Status: AC
Start: 2023-04-21 — End: ?

## 2023-04-21 NOTE — Nursing Note (Signed)
Chief Complaint:   Chief Complaint              Medication Check     Menstrual Problem           Functional Health Screen  Review Flowsheet  More data exists         04/21/2023   FUNCTIONAL HEALTH SCREENING   How often do you have a problem understanding what is told to you about your medical condition?  Never   Because we are aware of abuse and domestic violence today, we ask all patients: Are you being hurt, hit, or frightened by anyone at your home or in your life?  N   In the past year, have you been afraid of your partner or ex-partner? No   Do you feel physically safe and emotionally safe where you currently live? Yes   Do you have any basic needs within your home that are not being met? (such as Food, Shelter, [employer] fast streamer, Tranportation, paying for bills and/or medications) N   What is your housing situation? Has Housing   Are you worried about losing your housing? No   What is your current work situation? Unemployed   In the past year, have you or any family members you live with been unable to get any of the following when it was really needed?  No   Has lack of transportation kept you from medical appointments, meetings, work, or from getting things needed for daily living?  No   How often do you see or talk to people that you care about and feel close to? (For example: talking to friends on the phone, visiting friends or family, going to church or club meetings) >=5x a wk   Are any of these needs urgent? No   Do you have any advanced directives? No Advance   Would you like an advanced directive packet? Refused Packet      Details                 BP 115/69   Pulse 71   Temp 37 C (98.6 F)   Resp 14   Ht 1.575 m (5\' 2" )   Wt 51.3 kg (113 lb 3.2 oz)   LMP 04/17/2023 (Exact Date)   SpO2 98%   BMI 20.70 kg/m       Social History     Tobacco Use   Smoking Status Former    Current packs/day: 0.00    Average packs/day: 0.5 packs/day for 3.0 years (1.5 ttl pk-yrs)    Types: Cigarettes    Start date: 06/26/2016     Quit date: 06/27/2019    Years since quitting: 3.8   Smokeless Tobacco Former    Quit date: 04/13/2016     Patient Health Rating           Depression Screening  PHQ Questionnaire  Little interest or pleasure in doing things.: Not at all  Feeling down, depressed, or hopeless: Not at all  PHQ 2 Total: 0  Trouble falling or staying asleep, or sleeping too much.: Not at all  Feeling tired or having little energy: Nearly every day  Poor appetite or overeating: Nearly every day (poor appetite)  Feeling bad about yourself/ that you are a failure in the past 2 weeks?: Not at all  Trouble concentrating on things in the past 2 weeks?: Not at all  Moving/Speaking slowly or being fidgety or restless  in the past 2 weeks?: Several Days  Thoughts that  you would be better off DEAD, or of hurting yourself in some way.: Not at all  PHQ 9 Total: 7  Interpretation of Total Score: 5-9 Mild depression  Allergies:  No Known Allergies  Medication History  Reviewed for OTC medication and any new medications, provider will review medication history  Results through Enter/Edit  No results found for this or any previous visit (from the past 24 hour(s)).  POCT Results  Care Team  Patient Care Team:  Delsa Sale, MD as PCP - General (FAMILY MEDICINE)  Roxy [place], [place]  Gilford Rile, CMA  Immunizations - last 24 hours       None          Karma Lew, [place]  04/21/2023, 16:26

## 2023-04-21 NOTE — Progress Notes (Signed)
[facility]  FAMILY MEDICINE, [facility]  [address]  [place] [address]  Dept: [phone]  Loc: [phone]  Loc Fax: [phone]     Name: Shari Lawson MRN:  [id number]   Date: 04/21/2023 DoB: 06-01-1995           Chief complaint:   Chief Complaint   Patient presents with    Medication Check    Menstrual Problem       Subjective:  History of Presenting Illness: Shari Lawson is a 28 y.o. female who presents to clinic today with multiple concerns, including treatment of anxiety, depression, changes in her menstrual cycle, and abdominal pain associated with diet.    1. Shari Lawson has been taking Lexapro for symptoms of depression and anxiety, which she thinks has been improving on her current dose of 10 mg daily.  She does experience some mood lability, especially anger.  This can come on suddenly and fade just as quickly.  She can go some days without any of these feelings, and other days will experience it multiple times.     2. She reports that usually her periods occur monthly at the end of each month, but for the past 3-4 months the timing has shifted and for the month of November she skipped it entirely.  She took 1 home pregnancy test which was negative prior to her subsequent menstrual cycle.  She is sexually active and does not like hormonal birth control.  She does not want another pregnancy in the next year and uses condoms inconsistently.    3. Shari Lawson adds that she normally eats 1-2 meals per day because of how often she has abdominal pain after eating "anything. "  Her pain usually resolves in the next 1-2 hours.  This has been ongoing for many months to years.        Outpatient Medications Marked as Taking for the 04/21/23 encounter (Office Visit) with Delsa Sale, MD   Medication Sig    escitalopram oxalate (LEXAPRO) 20 mg Oral Tablet Take 1 Tablet (20 mg total) by mouth Once a day Indications: anxiousness associated  with depression           Objective  Vitals:   BP 115/69   Pulse 71   Temp 37 C (98.6 F)   Resp 14   Ht 1.575 m (5\' 2" )   Wt 51.3 kg (113 lb 3.2 oz)   LMP 04/17/2023 (Exact Date)   SpO2 98%   BMI 20.70 kg/m         Physical Exam:   General:  no acute distress;   HEENT:  Head:  normocephalic; atraumatic   Eyes: PER; EOM intact   Nose:  no erythema    Throat:  moist mucous membranes  Neck: neck supple; trachea midline  Cardiovascular:  Well-perfused  Respiratory:  Normal effort  Abdomen: soft; non-distended;   Extremities: no edema or cyanosis bilaterally  Musculoskeletal: grossly normal  Skin: warm, dry, normal turgor;   Neurologic: alert and oriented x3; grossly normal   Psychiatric: appears in good hygiene, anxious affect, slightly pressured speech        Assessment/Plan      Shari Lawson is a 28 y.o. female who presents to clinic today with cc of follow-up for depression and anxiety.        1. Moderate episode of recurrent major depressive disorder (CMS HCC) (Primary)  2. GAD (generalized anxiety disorder)  We  discussed the possibility of side effects from Lexapro versus inadequately treated depression/anxiety.  The pattern of her symptoms is more suggestive of disease over medication effect.  We will try a higher dose of Lexapro at this time.  She has been referred to Behavioral Medicine therapy in the past, but been unable to make these appointments.  Referral placed again today.  -     escitalopram oxalate (LEXAPRO) 20 mg Oral Tablet; Take 1 Tablet (20 mg total) by mouth Once a day Indications: anxiousness associated with depression  -     Refer to [employer]; Future    3. Irregular menses  Patient to track menstrual cycles and other symptoms prior to follow-up appointment.  We discussed the use of hormonal birth control for management of these issues, but she is reluctant to use any as she has had "bad experiences in the past. "  Counseled her on possibility of  unintended pregnancies without adequate birth control, and she expressed understanding.    4. Abdominal pain in female patient  Patient to complete symptom diary before next appointment.           ENCOUNTER DIAGNOSES     ICD-10-CM   1. Moderate episode of recurrent major depressive disorder (CMS HCC)  F33.1   2. GAD (generalized anxiety disorder)  F41.1   3. Irregular menses  N92.6   4. Abdominal pain in female patient  R10.9       Return to clinic: Return in about 2 months (around 06/19/2023) for MDD/GAD, abd pain, irregular menses.      Patient discussed with attending physician, Dr. Valentino Saxon    Delsa Sale, MD 04/21/2023, 17:10  PGY - 2  [facility]    I discussed the patient with the resident in person.  I reviewed the resident's note.  I agree with the findings and plan of care as documented in the resident's note.  Any exceptions/additions are edited/noted.    Alain Marion, MD

## 2023-04-27 ENCOUNTER — Ambulatory Visit (INDEPENDENT_AMBULATORY_CARE_PROVIDER_SITE_OTHER): Payer: Self-pay

## 2023-05-24 ENCOUNTER — Telehealth (INDEPENDENT_AMBULATORY_CARE_PROVIDER_SITE_OTHER): Payer: Self-pay

## 2023-05-24 NOTE — Telephone Encounter (Signed)
LVM to let patient know with weather coming to see if can do earlier then 2:30 PM schedule with Leslee Home on 2/11. Told patient can mover to 9, 10, 11 AM or 12, 12:30, or 1 PM. Told patient to call back to let us know.    [facility]

## 2023-05-25 ENCOUNTER — Ambulatory Visit (INDEPENDENT_AMBULATORY_CARE_PROVIDER_SITE_OTHER): Payer: Medicaid Other

## 2023-05-25 ENCOUNTER — Encounter (INDEPENDENT_AMBULATORY_CARE_PROVIDER_SITE_OTHER): Payer: Self-pay

## 2023-06-22 ENCOUNTER — Encounter (INDEPENDENT_AMBULATORY_CARE_PROVIDER_SITE_OTHER): Payer: Self-pay

## 2023-07-15 ENCOUNTER — Ambulatory Visit (INDEPENDENT_AMBULATORY_CARE_PROVIDER_SITE_OTHER)

## 2023-07-15 ENCOUNTER — Other Ambulatory Visit: Payer: Self-pay

## 2023-07-15 VITALS — BP 118/60 | HR 62 | Temp 98.1°F | Resp 15 | Ht 62.0 in | Wt 110.0 lb

## 2023-07-15 DIAGNOSIS — F331 Major depressive disorder, recurrent, moderate: Secondary | ICD-10-CM

## 2023-07-15 DIAGNOSIS — G4719 Other hypersomnia: Secondary | ICD-10-CM

## 2023-07-15 DIAGNOSIS — F411 Generalized anxiety disorder: Secondary | ICD-10-CM

## 2023-07-15 DIAGNOSIS — G4710 Hypersomnia, unspecified: Secondary | ICD-10-CM

## 2023-07-15 NOTE — Progress Notes (Unsigned)
 [facility]  FAMILY MEDICINE, [facility]  [address]  [place] [address]  Dept: [phone]  Loc: [phone]  Loc Fax: [phone]     Name: Shari Lawson MRN:  [id number]   Date: 07/15/2023 DoB: 1995/10/19           Chief complaint:   Chief Complaint   Patient presents with    Depression     Follow up    Anxiety     Follow up       Subjective  History of Presenting Illness: Shari Lawson is a 28 y.o. female who presents to clinic today with ***    PHQ Questionnaire  Little interest or pleasure in doing things.: Nearly every day  Feeling down, depressed, or hopeless: More than half the days  PHQ 2 Total: 5  Trouble falling or staying asleep, or sleeping too much.: Nearly every day  Feeling tired or having little energy: Nearly every day  Poor appetite or overeating: Several Days  Feeling bad about yourself/ that you are a failure in the past 2 weeks?: More than half the days  Trouble concentrating on things in the past 2 weeks?: More than half the days  Moving/Speaking slowly or being fidgety or restless  in the past 2 weeks?: Not at all  Thoughts that you would be better off DEAD, or of hurting yourself in some way.: More than half the days  If you checked off any problems, how difficult have these problems made it for you to do your work, take care of things at home, or get along with other people?: Very difficult  PHQ 9 Total: 18  Interpretation of Total Score: 15-19 Moderate/Severe depression        02/16/2022     3:00 PM 06/10/2022     2:00 PM 07/15/2023    11:40 AM   GAD-7 Questionnaire   Feeling nervous,anxious,on edge 3 3 3    Not being able to stop or control worrying 1 2 3    Worrying too much about different things 3 3 2    Trouble relaxing 0 2 2   Being so restless that it is hard to sit still 0 2 0   Becoming easily annoyed or irritable 3 3 3    Feeling afraid as if something awful might happen 1 3 0   How difficult have these  problems made it for you to work, take care of things at home, or get along with other people? Extremely difficult Extremely difficult    Gad-7 Score Total 11 18 13    Interpretation 10-14, moderate anxiety 15 or more, severe anxiety 10-14, moderate anxiety         07/15/2023    11:00 AM   Epworth Sleepiness Scale   Sitting and Reading 3   Watching TV 3   Sitting inactive in a public place. 1   As a passenger in a car for an hour without a break. 3   Lying down to rest in the afternoon when circumstances permit. 3   Sitting and Talking to someone 0   Sitting quietly after a lunch without alcohol 3   In a car, while stopped for a few minutes in traffic 0   Score total 16           History of Present Illness              Current Outpatient Medications   Medication Sig  escitalopram  oxalate (LEXAPRO ) 20 mg Oral Tablet Take 1 Tablet (20 mg total) by mouth Once a day Indications: anxiousness associated with depression           Objective  Vitals:   BP 118/60   Pulse 62   Temp 36.7 C (98.1 F) (Oral)   Resp 15   Ht 1.575 m (5\' 2" )   Wt 49.9 kg (110 lb)   LMP 06/28/2023 (Approximate)   BMI 20.12 kg/m         Physical Exam:   General:  patient is pleasant and in no acute distress;   HEENT:  Head:  normocephalic; atraumatic   Eyes: PER; EOM intact   Ears: no tragus tenderness; light reflex intact bilaterally; ***   Nose:  no erythema    Throat:  moist mucous membranes  Neck: neck supple; trachea midline  Cardiovascular:  well perfused***RRR, normal S1/S2 with no gallop or rubs; no murmur  Respiratory:  normal effort***CTA bilaterally; no crackles or wheezes   Abdomen: soft; non-distended;*** normoactive bowel sounds; no tenderness to palpation  Extremities: no edema or cyanosis bilaterally  Musculoskeletal: grossly normal***  Skin: warm, dry, normal turgor;   Neurologic: alert and oriented x3; grossly normal   Psychiatric: very pleasant and talkative; appears in good hygiene and appropriate mood        Assessment &  Plan      Shari Lawson is a 28 y.o. female who presents to clinic today with ***  ***  Assessment & Plan               1. GAD (generalized anxiety disorder) (Primary)  ***    2. 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 Excessive daytime sleepiness  ***  -     POLYSOMNOGRAPHY - OVERNIGHT (ANP) 1ST NIGHT (F/U W/CPAP IF ANP MEETS CLINICAL CRITERIA); Future  -     POLYSOMNOGRAPHY - MULT SLEEP LATENCY TEST (MUST ORDER W/ SLE EAST5); Future    4. Hypersomnolence  ***  -     POLYSOMNOGRAPHY - OVERNIGHT (ANP) 1ST NIGHT (F/U W/CPAP IF ANP MEETS CLINICAL CRITERIA); Future  -     POLYSOMNOGRAPHY - MULT SLEEP LATENCY TEST (MUST ORDER W/ SLE EAST5); Future           ENCOUNTER DIAGNOSES     ICD-10-CM   1. GAD (generalized anxiety disorder)  F41.1   2. 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 Excessive daytime sleepiness  G47.19   4. Hypersomnolence  G47.10                     Return to clinic: Return in about 3 months (around 10/14/2023) for Annual.      Patient seen and discussed with attending physician Dr. Alfredo Inch        Chelsea Cordia, MD 07/15/2023, 11:43  PGY - 2  [place]  [facility]
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
## 2023-08-09 ENCOUNTER — Encounter (INDEPENDENT_AMBULATORY_CARE_PROVIDER_SITE_OTHER): Payer: Self-pay

## 2023-08-30 ENCOUNTER — Ambulatory Visit: Attending: Family Medicine

## 2023-08-30 ENCOUNTER — Encounter (INDEPENDENT_AMBULATORY_CARE_PROVIDER_SITE_OTHER): Payer: Self-pay

## 2023-08-30 ENCOUNTER — Other Ambulatory Visit: Payer: Self-pay

## 2023-08-30 ENCOUNTER — Ambulatory Visit (INDEPENDENT_AMBULATORY_CARE_PROVIDER_SITE_OTHER): Payer: Self-pay

## 2023-08-30 VITALS — BP 102/58 | HR 72 | Temp 98.5°F | Resp 18 | Ht 62.0 in | Wt 106.8 lb

## 2023-08-30 DIAGNOSIS — E039 Hypothyroidism, unspecified: Secondary | ICD-10-CM | POA: Insufficient documentation

## 2023-08-30 DIAGNOSIS — G4710 Hypersomnia, unspecified: Secondary | ICD-10-CM

## 2023-08-30 DIAGNOSIS — F331 Major depressive disorder, recurrent, moderate: Secondary | ICD-10-CM

## 2023-08-30 DIAGNOSIS — F411 Generalized anxiety disorder: Secondary | ICD-10-CM

## 2023-08-30 LAB — CBC WITH DIFF
BASOPHIL #: <0.1 10*3/uL (ref <=0.20)
BASOPHIL %: 1 %
EOSINOPHIL #: <0.1 10*3/uL (ref <=0.50)
EOSINOPHIL %: 0.6 %
HCT: 40.6 % (ref 34.8–46.0)
HGB: 13.3 g/dL (ref 11.5–16.0)
IMMATURE GRANULOCYTE #: <0.1 10*3/uL (ref <0.10)
IMMATURE GRANULOCYTE %: 0.2 % (ref 0.0–1.0)
LYMPHOCYTE #: 0.9 10*3/uL — ABNORMAL LOW (ref 1.00–4.80)
LYMPHOCYTE %: 17.6 %
MCH: 30.1 pg (ref 26.0–32.0)
MCHC: 32.8 g/dL (ref 31.0–35.5)
MCV: 91.9 fL (ref 78.0–100.0)
MONOCYTE #: 0.58 10*3/uL (ref 0.20–1.10)
MONOCYTE %: 11.4 %
MPV: 10.4 fL (ref 8.7–12.5)
NEUTROPHIL #: 3.53 10*3/uL (ref 1.50–7.70)
NEUTROPHIL %: 69.2 %
PLATELETS: 334 10*3/uL (ref 150–400)
RBC: 4.42 10*6/uL (ref 3.85–5.22)
RDW-CV: 13.1 % (ref 11.5–15.5)
WBC: 5.1 10*3/uL (ref 3.7–11.0)

## 2023-08-30 LAB — THYROID STIMULATING HORMONE WITH FREE T4 REFLEX: TSH: 1.462 u[IU]/mL (ref 0.350–4.940)

## 2023-08-30 LAB — VITAMIN D 25 TOTAL: VITAMIN D 25, TOTAL: 30.8 ng/mL (ref 20.0–100.0)

## 2023-08-30 NOTE — Progress Notes (Signed)
 [facility]  FAMILY MEDICINE, [facility]  [address]  [place] [address]  Dept: [phone]  Loc: [phone]  Loc Fax: [phone]     Name: Shari Lawson MRN:  [id number]   Date: 08/30/2023 DoB: [birth year]           Chief complaint:   Chief Complaint   Patient presents with    Blood Work Request    Fatigue       Subjective  History of Presenting Illness: Shari Lawson is a 28 y.o. female who presents to clinic today with continued symptoms of significant fatigue and hypersomnolence.  She is able to sleep 12 hours most nights and will take naps during the day when her children do.  For the past month she reports new symptoms of sudden hot flashes that will occur at night and during the day.  They develop rapidly and tend to resolve within minutes.  They are associated with sweating but she denies any other symptoms, including palpitations, dyspnea, nausea, lightheadedness, weakness, headaches or changes in vision.         02/16/2022     3:00 PM 06/10/2022     2:00 PM 07/15/2023    11:40 AM 08/30/2023     1:00 PM   GAD-7 Questionnaire   Feeling nervous,anxious,on edge 3 3 3 1    Not being able to stop or control worrying 1 2 3  0   Worrying too much about different things 3 3 2  0   Trouble relaxing 0 2 2 0   Being so restless that it is hard to sit still 0 2 0 0   Becoming easily annoyed or irritable 3 3 3 1    Feeling afraid as if something awful might happen 1 3 0 0   How difficult have these problems made it for you to work, take care of things at home, or get along with other people? Extremely difficult Extremely difficult  Somewhat difficult   Gad-7 Score Total 11 18 13 2    Interpretation 10-14, moderate anxiety 15 or more, severe anxiety 10-14, moderate anxiety 0-4, normal     PHQ Questionnaire  Little interest or pleasure in doing things.: Several Days  Feeling down, depressed, or hopeless: Several Days  PHQ 2 Total: 2  Trouble  falling or staying asleep, or sleeping too much.: Nearly every day  Feeling tired or having little energy: Nearly every day  Poor appetite or overeating: Nearly every day  Feeling bad about yourself/ that you are a failure in the past 2 weeks?: Not at all  Trouble concentrating on things in the past 2 weeks?: Not at all  Moving/Speaking slowly or being fidgety or restless  in the past 2 weeks?: Nearly every day  Thoughts that you would be better off DEAD, or of hurting yourself in some way.: Not at all  If you checked off any problems, how difficult have these problems made it for you to do your work, take care of things at home, or get along with other people?: Not difficult at all  PHQ 9 Total: 14  Interpretation of Total Score: 10-14 Moderate depression    Review of Systems -   Review of Systems   Constitutional:  Positive for diaphoresis, fatigue and fever. Negative for activity change, appetite change, chills and unexpected weight change.   Eyes:  Negative for visual disturbance.   Respiratory:  Negative for chest tightness and shortness of breath.  Cardiovascular:  Negative for chest pain and palpitations.   Gastrointestinal:  Negative for constipation, diarrhea and nausea.   Endocrine: Positive for heat intolerance. Negative for polydipsia and polyphagia.   Skin:  Negative for rash.   Neurological:  Negative for dizziness, syncope, weakness, light-headedness, numbness and headaches.   Psychiatric/Behavioral:  Positive for agitation, dysphoric mood and sleep disturbance. Negative for suicidal ideas.          Current Outpatient Medications   Medication Sig    escitalopram  oxalate (LEXAPRO ) 20 mg Oral Tablet Take 1 Tablet (20 mg total) by mouth Once a day Indications: anxiousness associated with depression           Objective  Vitals:   BP (!) 102/58   Pulse 72   Temp 36.9 C (98.5 F)   Resp 18   Ht 1.575 m (5\' 2" )   Wt 48.4 kg (106 lb 12.8 oz)   LMP 08/28/2023 (Exact Date)   BMI 19.53 kg/m          Physical Exam:   General:  patient is pleasant and in no acute distress;   HEENT:  Head:  normocephalic; atraumatic   Eyes: PER; EOM intact   Nose:  no erythema    Throat:  moist mucous membranes  Neck: neck supple; trachea midline  Cardiovascular:  well perfused  Respiratory:  normal effort  Abdomen: soft; non-distended;  Extremities: no edema or cyanosis bilaterally  Musculoskeletal: grossly normal  Skin: warm, dry, normal turgor;   Neurologic: alert and oriented x3; grossly normal   Psychiatric: very pleasant and talkative; appears in good hygiene and appropriate mood        Assessment & Plan      Shari Lawson is a 28 y.o. female who presents to clinic today with new symptoms of heat intolerance.        1. GAD (generalized anxiety disorder) (Primary)  2. 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 Still suspect new symptoms are largely medication effect. Shari Lawson will decrease to 10 mg daily 2 weeks before her sleep study, then hold the medication for 1 week before her sleep study. If symptoms remain, consider initiating bupropion.     3. Hypersomnolence  4. Possible hypothyroidism, unspecified type  Will evaluate for other organic causes  -     VITAMIN B12; Future  -     VITAMIN D  25 TOTAL; Future  -     THYROID  STIMULATING HORMONE WITH FREE T4 REFLEX; Future  -     CBC/DIFF; Future         ENCOUNTER DIAGNOSES     ICD-10-CM   1. GAD (generalized anxiety disorder)  F41.1   2. 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 Hypersomnolence  G47.10   4. Hypothyroidism, unspecified type  E03.9             Return to clinic: Return in about 3 months (around 11/30/2023) for GAD/MDD.      Patient seen and discussed with attending physician Dr. Benigno Brakeman.      Chelsea Cordia, MD 08/30/2023, 13:32  PGY - 2  [place]  [facility]  Co-sign  The above  note by the resident reflects the resident's collection of patient data . I was in clinic at this time, and met the patient myself; I discussed patient's history, read through this note - discussed  care and agree with current assessment and plan    Rexanne Catalina, MD
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
## 2023-08-30 NOTE — Patient Instructions (Addendum)
 You should decrease your escitalopram  dose by half to 10 mg two weeks before your sleep study (decrease dose July 15) and stop taking the medicine 7 days before the sleep test. (Stop 7/22)

## 2023-08-31 LAB — VITAMIN B12: VITAMIN B 12: 614 pg/mL (ref 200–900)

## 2023-09-01 ENCOUNTER — Ambulatory Visit (HOSPITAL_COMMUNITY): Payer: Self-pay

## 2023-11-06 IMAGING — MR MRI BRAIN W/WO CONTRAST
16 series · 44 of 48 positions shown · IV contrast (prohance)
Comparison: Brain MRI without and with contrast 08/21/2020.

HISTORY: 26 year old female with hyperprolactinemia.
TECHNIQUE: Multiplanar, multisequence MRI images of the brain are obtained prior to and following intravenous contrast.

CONTRAST: 15 mL of ProHance

[Series 5: t1_sag · sagittal · 5.0mm · 0.72mm/px · 3 of 25 slices shown]
[im 1/25]
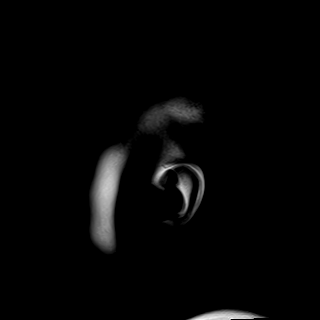
[im 13/25]
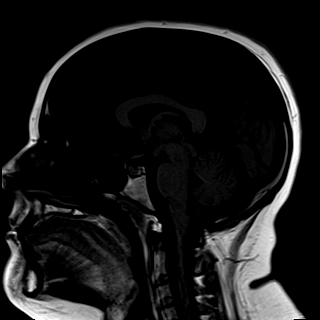
[im 25/25]
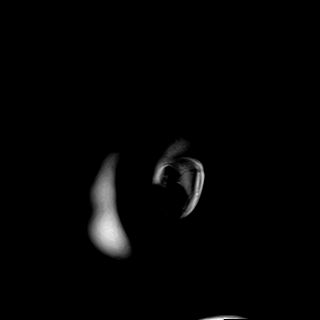

[Series 6: flair_axial_fs · axial · 4.0mm · 0.72mm/px · z∈[-48,+102]mm · 4 of 30 slices shown]
[im 1/30]
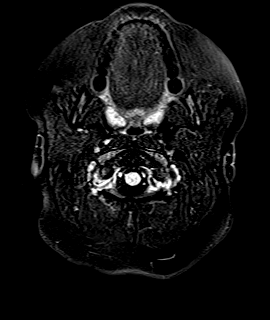
[im 10/30]
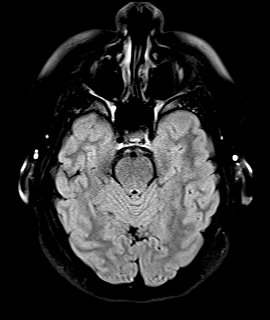
[im 20/30]
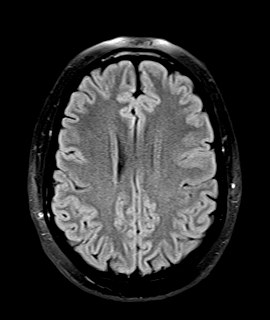
[im 30/30]
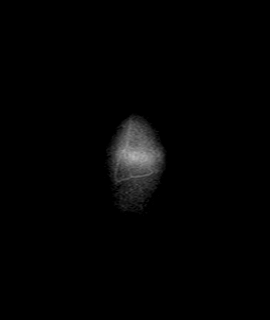

[Series 7: flash_axial · axial · 4.0mm · 0.90mm/px · z∈[-49,+101]mm · 5 of 30 slices shown]
[im 1/30]
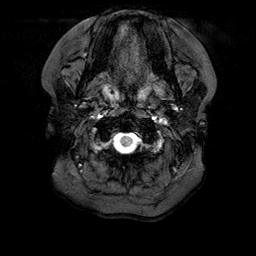
[im 8/30]
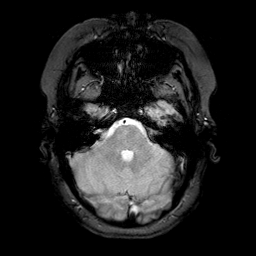
[im 15/30]
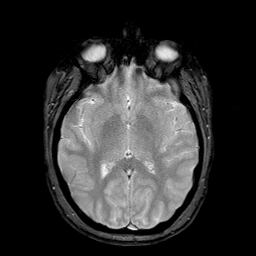
[im 22/30]
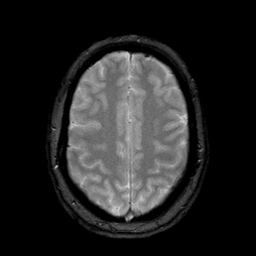
[im 30/30]
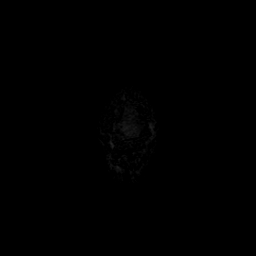

[Series 8: DWI · axial · 4.0mm · 1.31mm/px · z∈[-49,+101]mm · 5 of 30 slices shown (1 of 2)]
[im 1/30]
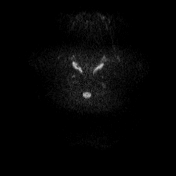
[im 8/30]
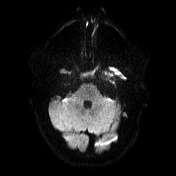
[im 15/30]
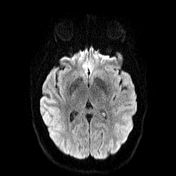
[im 22/30]
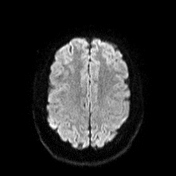
[im 30/30]
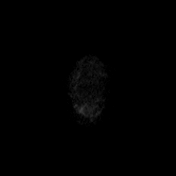

[Series 9: DWI · axial · 4.0mm · 1.31mm/px · z∈[-49,+101]mm · 5 of 30 slices shown (2 of 2)]
[im 1/30]
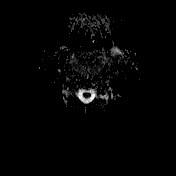
[im 8/30]
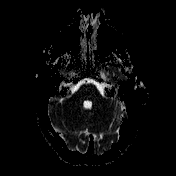
[im 15/30]
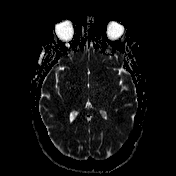
[im 22/30]
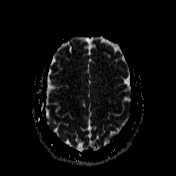
[im 30/30]
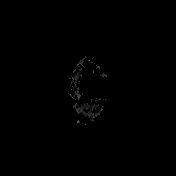

[Series 10: t2_cor_2.5 · coronal · 2.5mm · 0.62mm/px · 3 of 16 slices shown]
[im 1/16]
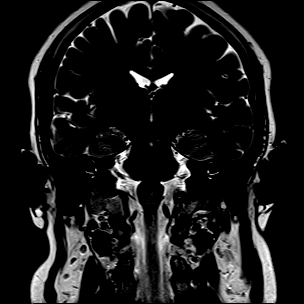
[im 8/16]
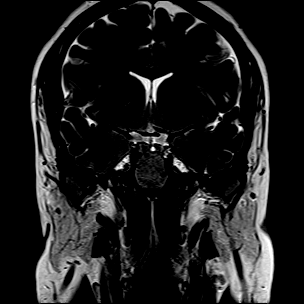
[im 16/16]
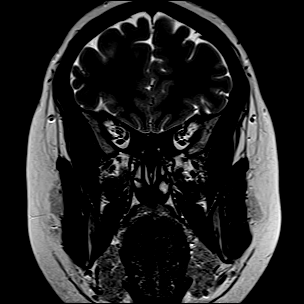

[Series 11: t1_sag_3mm · sagittal · 3.0mm · 0.35mm/px · 2 of 14 slices shown]
[im 1/14]
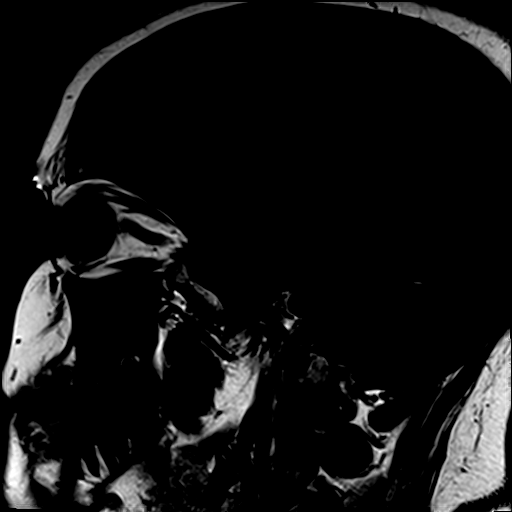
[im 14/14]
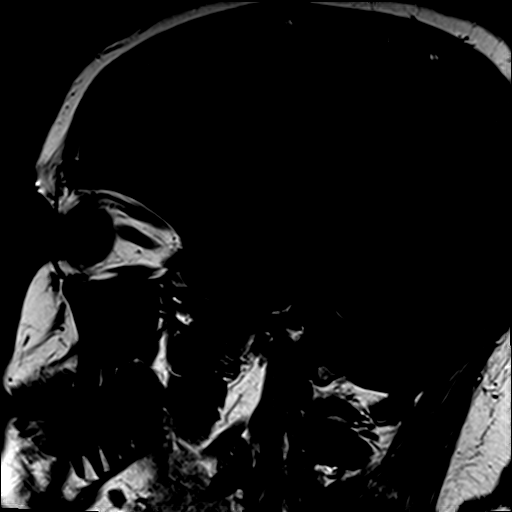

[Series 12: t1_cor_fs_pre · coronal · 3.0mm · 0.74mm/px · 2 of 12 slices shown]
[im 1/12]
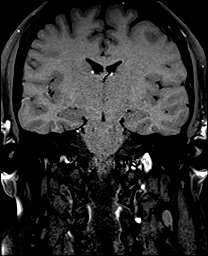
[im 12/12]
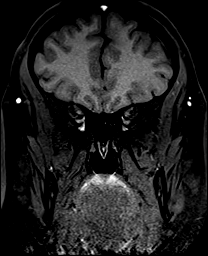

[Series 13: t1_cor_pre · coronal · 2.5mm · 0.36mm/px · 2 of 9 slices shown]
[im 1/9]
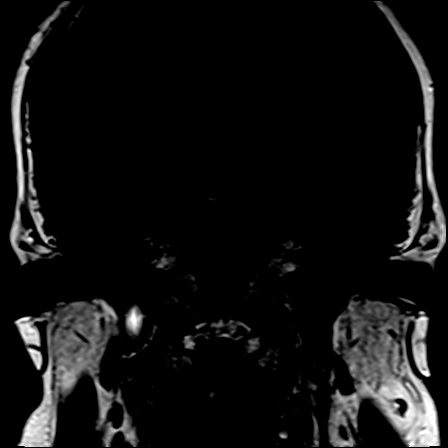
[im 9/9]
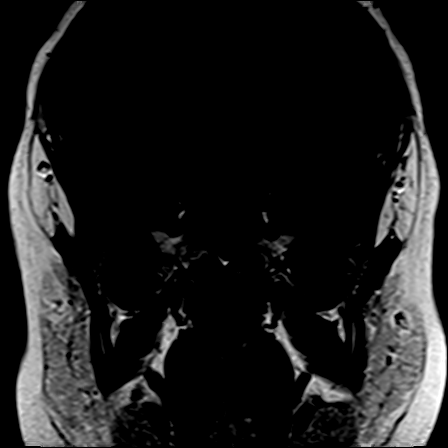

[Series 14: t1_cor_0 · coronal · 2.5mm · 0.36mm/px · 2 of 9 slices shown]
[im 1/9]
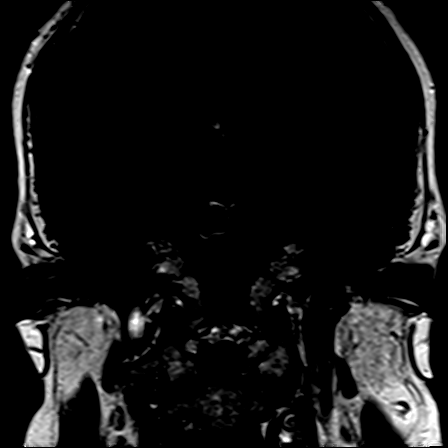
[im 9/9]
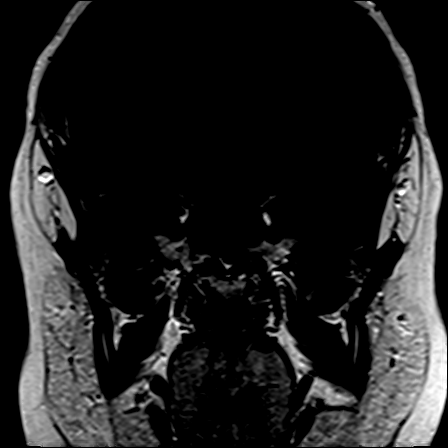

[Series 15: t1_cor_30 · coronal · 2.5mm · 0.36mm/px · 2 of 9 slices shown]
[im 1/9]
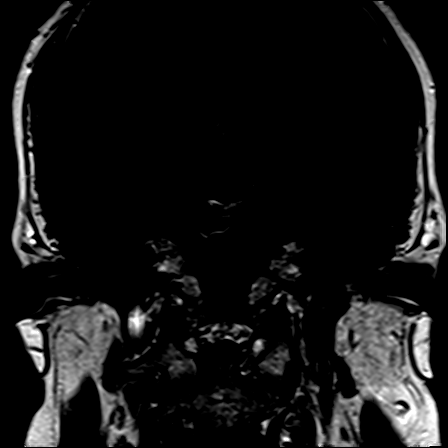
[im 9/9]
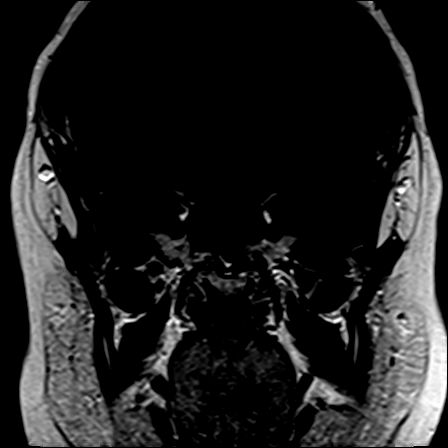

[Series 16: t1_cor_60 · coronal · 2.5mm · 0.36mm/px · 2 of 9 slices shown]
[im 1/9]
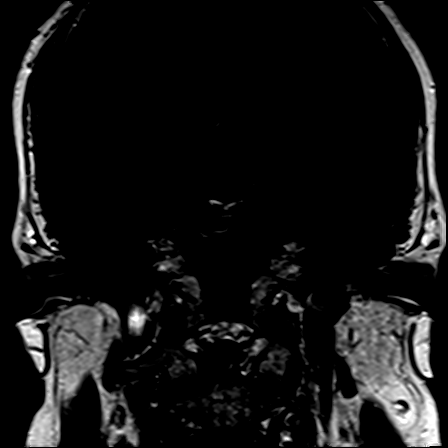
[im 9/9]
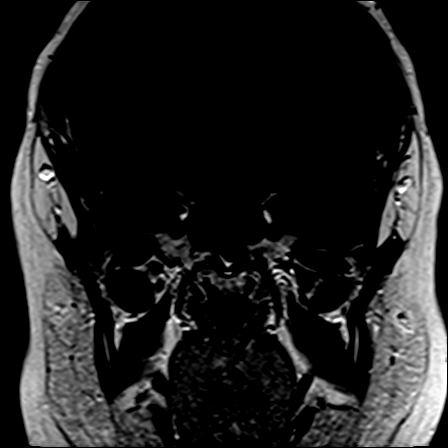

[Series 17: t1_cor_90 · coronal · 2.5mm · 0.36mm/px · 2 of 9 slices shown]
[im 1/9]
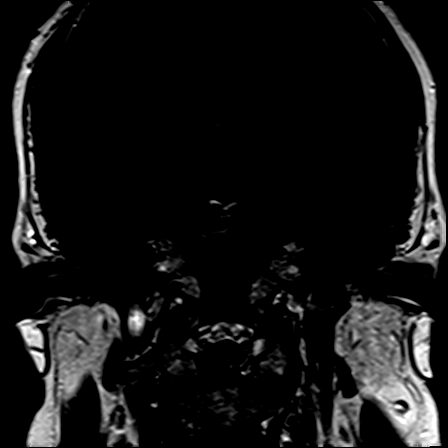
[im 9/9]
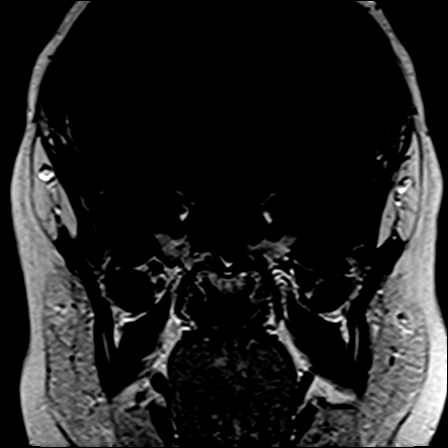

[Series 18: t1_sag_+c · sagittal · 3.0mm · 0.35mm/px · 2 of 14 slices shown]
[im 1/14]
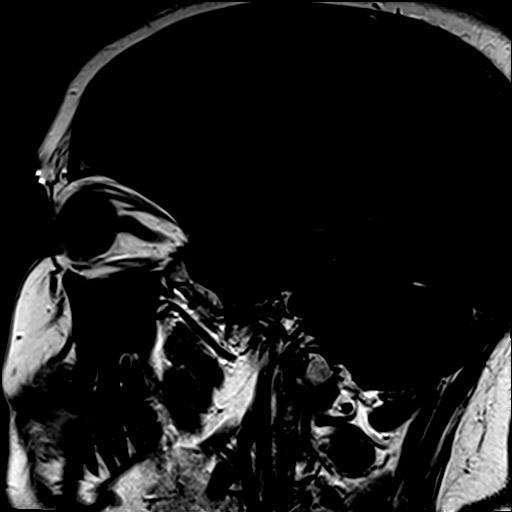
[im 14/14]
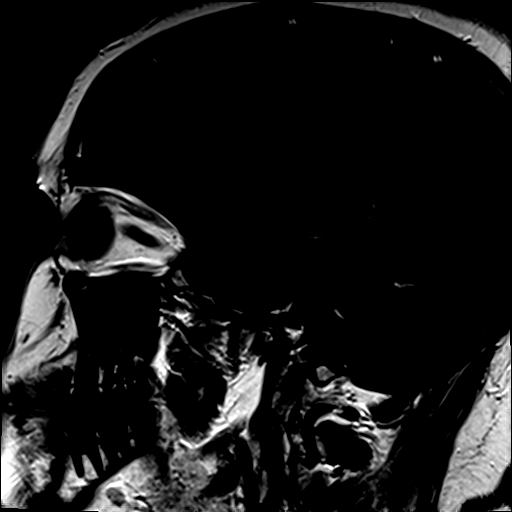

[Series 19: t1_cor_fs_+c · coronal · 3.0mm · 0.74mm/px · 2 of 12 slices shown]
[im 1/12]
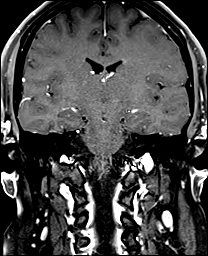
[im 12/12]
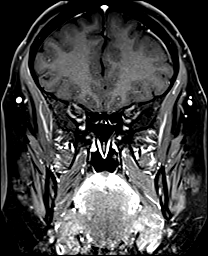

[Series 20: t1_axial_+c · axial · 4.0mm · 0.72mm/px · 1 of 30 slices shown]
[im 1/30]
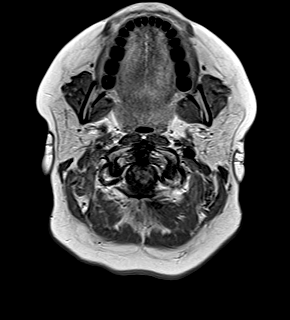

[44 of 48 positions shown; findings below may reference images not displayed]

FINDINGS: PITUITARY GLAND: Redemonstration of 2 mm T2 hyperintense hypoenhancing nodule in the left paracentral posterior pituitary, which may represents a Rathke's cleft cyst or a pituitary microadenoma. This is unchanged since previous MRI on 08/21/2020

OPTIC CHIASM: Normal in appearance, without deviation or compression.

CAVERNOUS SINUSES: Symmetric, without displacement or invasion.

OTHER: Limited evaluation of the structures adjacent to the sella are unremarkable.
IMPRESSION: Similar appearance of 2 mm hypoenhancing nodule in the left paracentral posterior pituitary, which may represent a Rathke's cleft cyst or a pituitary microadenoma. Unchanged since 08/21/2020.

## 2023-11-09 ENCOUNTER — Ambulatory Visit

## 2023-11-10 ENCOUNTER — Encounter

## 2023-12-01 ENCOUNTER — Encounter (INDEPENDENT_AMBULATORY_CARE_PROVIDER_SITE_OTHER): Payer: Self-pay

## 2024-02-02 ENCOUNTER — Encounter (INDEPENDENT_AMBULATORY_CARE_PROVIDER_SITE_OTHER): Payer: Self-pay

## 2024-02-02 ENCOUNTER — Other Ambulatory Visit: Payer: Self-pay

## 2024-02-02 ENCOUNTER — Ambulatory Visit (INDEPENDENT_AMBULATORY_CARE_PROVIDER_SITE_OTHER): Payer: Self-pay

## 2024-02-02 VITALS — BP 96/58 | HR 75 | Temp 98.9°F | Resp 16 | Ht 62.0 in | Wt 114.0 lb

## 2024-02-02 DIAGNOSIS — E039 Hypothyroidism, unspecified: Secondary | ICD-10-CM

## 2024-02-02 DIAGNOSIS — F411 Generalized anxiety disorder: Secondary | ICD-10-CM
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
## 2024-02-02 NOTE — Progress Notes (Signed)
 [facility]  FAMILY MEDICINE, [facility]  [address]  [place] [address]  Dept: [phone]  Loc: [phone]  Loc Fax: [phone]       Name: Shari Lawson MRN:  [id number]   Date: 02/02/2024 DOB: 08/25/1995         Chief complaint:   Chief Complaint   Patient presents with    Anxiety       SUBJECTIVE      History of Present Illness  Shari Lawson is a 28 y.o. female who presents to clinic today for a follow-up visit.    Pertinent Interval History (since last appointment): No recent ED/Urgent care visit or hospitalizations.     She is unsure of the specific reason for the visit but mentions that everything is stable for her currently.    She has a history of hypothyroidism and is currently taking Levothyroxine. She believes her condition has been stable, last TSH WNL in May, 2025    She was supposed to undergo a sleep study due to concerns about falling asleep quickly, but she missed the appointment because she did not have a babysitter. She describes that at one point, she could lay down and fall asleep immediately, which was unusual for her. She has not rescheduled the sleep study due to childcare constraints.    She is currently taking Aleve in the morning before breakfast and Lexapro  for anxiety. She is on a 20 mg dose of Lexapro  and believes it is effective. On chart review, taper of medication was planned but not continued due not being able to perform the sleep study.    Review of Systems  All other systems were reviewed and are negative.     Medical History   Problem List[1]     Medications   escitalopram  oxalate (LEXAPRO ) 20 mg Oral Tablet, Take 1 Tablet (20 mg total) by mouth Once a day Indications: anxiousness associated with depression    No facility-administered medications prior to visit.     Allergies   Allergies[2]     Immunizations   Immunization History   Administered Date(s) Administered    AFLURIA-Influenza  Vaccine,3 Yr+ 01/27/2017    DIPTH,PERTUSSIS-ACEL,TETANUS >10 YRS OLD 09/26/2019    Diptheria/Tetanus/Pertussis 6wk to <91yrs 08/14/1996, 10/24/1996, 02/06/1997, 10/01/1997, 10/09/2003    Flumist (Nasal) Influenza Vaccine, 2-49 yrs 01/18/2012    HPV VACCINE 05/31/2008    Hib Family 08/14/1996, 10/24/1996, 02/06/1997, 10/01/1997    Hpv Family 10/25/2007, 01/24/2008, 05/31/2008    Influenza Vaccine, 6 month-adult 12/23/2010, 01/27/2017    MEASLES/MUMPS/RUBELLA VIRUS VACCINE 06/26/1997, 02/08/2001, 11/29/2019    Mening Family 10/25/2007    Meningococcal Vaccine 04/25/2013    Polio Family Dec 30, 1996, 08/14/1996, 10/24/1996, 02/06/1997, 10/23/2005, 02/08/2006, 10/25/2007    RHOGAM 02/24/2017, 11/29/2019    Tetanus Toxoid/Diphtheria Toxoid/Acellular Pertussis Vaccine, Adsorbed 12/09/2016    Tetanus/diptheria-td-adult(admin) 07/02/2008    VARICELLA VACCINE LIVE 06/26/1997, 10/25/2007      Past Surgical History   Past Surgical History:   Procedure Laterality Date    HX DENTAL EXTRACTION Right      Family History   Family Medical History:       Problem Relation (Age of Onset)    Hypertension (High Blood Pressure) Father    No Known Problems Mother, Sister, Brother          OBJECTIVE    Vital Signs :   BP (!) 96/58   Pulse 75   Temp 37.2 C (98.9  F) (Tympanic)   Resp 16   Ht 1.575 m (5' 2)   Wt 51.7 kg (114 lb)   BMI 20.85 kg/m   Interpretation: Vitals signs were reviewed, all within normal limits.    Physical Exam:  General appearance: alert, well appearing, and in no distress  HEENT: extraocular eye movements intact, mucous membranes moist, pharynx normal without lesions, supple, no significant adenopathy  Respiratory: clear to auscultation, no wheezes, rales or rhonchi, symmetric air entry  Cardiovascular: normal rate, regular rhythm, normal S1, S2, no murmurs, rubs, clicks or gallops. No carotid bruits  GI: soft, nontender, nondistended, no masses or organomegaly  Musculoskeletal:  no joint tenderness, deformity or  swelling  Extremities: peripheral pulses normal, no pedal edema, no clubbing or cyanosis  Skin:  normal coloration and turgor, no rashes, no suspicious skin lesions noted  Mental status: alert, oriented to person, place, and time     ASSESSMENT & PLAN:    Shari Lawson is a 28 y.o. female who presents to clinic today for follow up.      ICD-10-CM    1. GAD (generalized anxiety disorder)  F41.1          Assessment & Plan  Hypothyroidism  Hypothyroidism is well-managed on current levothyroxine therapy. Last thyroid  function tests in 2023 were within normal limits. No new symptoms suggestive of altered thyroid  function.  - Order thyroid  function tests to ensure stability of hypothyroidism.    Generalized Anxiety Disorder  Generalized Anxiety Disorder is well-controlled with Lexapro  20 mg daily. Mood is stable with no thoughts of self-harm or harm to others. No new symptoms indicating a need for medication adjustment.  - Continue Lexapro  20 mg daily.  - Ensure Lexapro  prescription is refilled for one year.    General Health Maintenance  Pap smear on Aug 12, 2022, was negative for HPV. She is up to date with Pap smears. No recent flu or COVID vaccinations. Discussion about HPV vaccination was initiated.  - Offer HPV vaccination.  - Discuss and offer flu and COVID vaccinations.    Recording duration: 14 minutes    Discussed patient treatment plan with attending physician, Dr. Lonell Boron, DO     Earline Kerns, DO, MS 02/02/2024, 18:20  Resident Physician [place]  [facility]  Disclaimer: Portions of this note have been dictated using voice recognition software.  Variances in spelling and vocabulary are possible and unintentional. Please notify the dino if any discrepancies are noted or if the meaning of any statement is not clear.     ENCOUNTER DIAGNOSES     ICD-10-CM   1. GAD (generalized anxiety disorder)  F41.1       No outpatient medications have been marked as taking for the  02/02/24 encounter (Office Visit) with Kerns Natter, DO.          I discussed the patient with the resident in clinic.  I reviewed the resident's note.  I agree with the findings and plan of care as documented in the resident's note.  Any exceptions/additions are edited/noted.    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
